# Patient Record
Sex: Male | Born: 1955 | Race: White | Hispanic: No | Marital: Married | Smoking: Current every day smoker
Health system: Southern US, Community
[De-identification: ages and names within clinical notes are randomized; demographics above are authoritative.]

## PROBLEM LIST (undated history)

## (undated) DIAGNOSIS — I10 Essential (primary) hypertension: Secondary | ICD-10-CM

## (undated) DIAGNOSIS — F32A Depression, unspecified: Secondary | ICD-10-CM

## (undated) DIAGNOSIS — E785 Hyperlipidemia, unspecified: Secondary | ICD-10-CM

## (undated) DIAGNOSIS — K759 Inflammatory liver disease, unspecified: Secondary | ICD-10-CM

## (undated) DIAGNOSIS — F410 Panic disorder [episodic paroxysmal anxiety] without agoraphobia: Secondary | ICD-10-CM

## (undated) DIAGNOSIS — E039 Hypothyroidism, unspecified: Secondary | ICD-10-CM

## (undated) DIAGNOSIS — M51369 Other intervertebral disc degeneration, lumbar region without mention of lumbar back pain or lower extremity pain: Secondary | ICD-10-CM

## (undated) DIAGNOSIS — M549 Dorsalgia, unspecified: Secondary | ICD-10-CM

## (undated) DIAGNOSIS — I519 Heart disease, unspecified: Secondary | ICD-10-CM

## (undated) DIAGNOSIS — J309 Allergic rhinitis, unspecified: Secondary | ICD-10-CM

## (undated) DIAGNOSIS — M5136 Other intervertebral disc degeneration, lumbar region: Secondary | ICD-10-CM

## (undated) DIAGNOSIS — J329 Chronic sinusitis, unspecified: Secondary | ICD-10-CM

## (undated) DIAGNOSIS — M5030 Other cervical disc degeneration, unspecified cervical region: Secondary | ICD-10-CM

## (undated) DIAGNOSIS — R7303 Prediabetes: Secondary | ICD-10-CM

## (undated) DIAGNOSIS — R519 Headache, unspecified: Secondary | ICD-10-CM

## (undated) DIAGNOSIS — I2510 Atherosclerotic heart disease of native coronary artery without angina pectoris: Secondary | ICD-10-CM

## (undated) HISTORY — DX: Chronic sinusitis, unspecified: J32.9

## (undated) HISTORY — DX: Hypothyroidism, unspecified: E03.9

## (undated) HISTORY — PX: OTHER SURGICAL HISTORY: SHX169

## (undated) HISTORY — DX: Dorsalgia, unspecified: M54.9

## (undated) HISTORY — DX: Heart disease, unspecified: I51.9

## (undated) HISTORY — DX: Other cervical disc degeneration, unspecified cervical region: M50.30

## (undated) HISTORY — DX: Allergic rhinitis, unspecified: J30.9

## (undated) HISTORY — DX: Hyperlipidemia, unspecified: E78.5

## (undated) HISTORY — DX: Panic disorder (episodic paroxysmal anxiety): F41.0

## (undated) HISTORY — PX: TONSILLECTOMY: SUR1361

## (undated) HISTORY — DX: Essential (primary) hypertension: I10

---
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
## 2004-05-20 ENCOUNTER — Ambulatory Visit (HOSPITAL_COMMUNITY): Admission: RE | Admit: 2004-05-20 | Discharge: 2004-05-20 | Payer: Self-pay | Admitting: Internal Medicine

## 2004-06-17 ENCOUNTER — Ambulatory Visit: Payer: Self-pay | Admitting: Gastroenterology

## 2004-07-18 ENCOUNTER — Ambulatory Visit: Payer: Self-pay | Admitting: Gastroenterology

## 2004-08-26 ENCOUNTER — Ambulatory Visit: Payer: Self-pay | Admitting: Gastroenterology

## 2004-09-19 ENCOUNTER — Ambulatory Visit: Payer: Self-pay | Admitting: Internal Medicine

## 2004-10-03 ENCOUNTER — Ambulatory Visit: Payer: Self-pay | Admitting: Gastroenterology

## 2004-10-21 ENCOUNTER — Ambulatory Visit: Payer: Self-pay | Admitting: Gastroenterology

## 2004-11-07 ENCOUNTER — Ambulatory Visit: Payer: Self-pay | Admitting: Gastroenterology

## 2004-12-05 ENCOUNTER — Ambulatory Visit: Payer: Self-pay | Admitting: Internal Medicine

## 2005-01-02 ENCOUNTER — Ambulatory Visit: Payer: Self-pay | Admitting: Internal Medicine

## 2005-01-30 ENCOUNTER — Ambulatory Visit: Payer: Self-pay | Admitting: Internal Medicine

## 2005-02-27 ENCOUNTER — Ambulatory Visit: Payer: Self-pay | Admitting: Gastroenterology

## 2005-03-27 ENCOUNTER — Ambulatory Visit: Payer: Self-pay | Admitting: Internal Medicine

## 2005-04-24 ENCOUNTER — Ambulatory Visit: Payer: Self-pay | Admitting: Gastroenterology

## 2005-05-22 ENCOUNTER — Ambulatory Visit: Payer: Self-pay | Admitting: Gastroenterology

## 2005-06-19 ENCOUNTER — Ambulatory Visit: Payer: Self-pay | Admitting: Gastroenterology

## 2005-07-10 ENCOUNTER — Ambulatory Visit: Payer: Self-pay | Admitting: Gastroenterology

## 2005-10-23 ENCOUNTER — Ambulatory Visit: Payer: Self-pay | Admitting: Gastroenterology

## 2006-01-29 ENCOUNTER — Ambulatory Visit: Payer: Self-pay | Admitting: Gastroenterology

## 2010-07-21 HISTORY — PX: CHOLECYSTECTOMY: SHX55

## 2012-12-29 DIAGNOSIS — M47817 Spondylosis without myelopathy or radiculopathy, lumbosacral region: Secondary | ICD-10-CM | POA: Insufficient documentation

## 2012-12-29 DIAGNOSIS — IMO0002 Reserved for concepts with insufficient information to code with codable children: Secondary | ICD-10-CM | POA: Insufficient documentation

## 2013-08-04 DIAGNOSIS — F411 Generalized anxiety disorder: Secondary | ICD-10-CM | POA: Insufficient documentation

## 2015-04-20 ENCOUNTER — Other Ambulatory Visit (HOSPITAL_COMMUNITY): Payer: Self-pay | Admitting: Physical Medicine and Rehabilitation

## 2015-04-20 DIAGNOSIS — M542 Cervicalgia: Secondary | ICD-10-CM

## 2015-05-05 ENCOUNTER — Ambulatory Visit (HOSPITAL_BASED_OUTPATIENT_CLINIC_OR_DEPARTMENT_OTHER): Payer: Self-pay

## 2015-05-05 ENCOUNTER — Ambulatory Visit (HOSPITAL_BASED_OUTPATIENT_CLINIC_OR_DEPARTMENT_OTHER)
Admission: RE | Admit: 2015-05-05 | Discharge: 2015-05-05 | Disposition: A | Discharge status: Home / Self Care | Payer: BLUE CROSS/BLUE SHIELD | Source: Ambulatory Visit | Attending: Physical Medicine and Rehabilitation | Admitting: Physical Medicine and Rehabilitation

## 2015-05-05 DIAGNOSIS — M542 Cervicalgia: Secondary | ICD-10-CM | POA: Diagnosis present

## 2015-05-05 DIAGNOSIS — M2578 Osteophyte, vertebrae: Secondary | ICD-10-CM | POA: Insufficient documentation

## 2015-05-05 DIAGNOSIS — R200 Anesthesia of skin: Secondary | ICD-10-CM | POA: Diagnosis not present

## 2015-05-05 DIAGNOSIS — M4802 Spinal stenosis, cervical region: Secondary | ICD-10-CM | POA: Diagnosis not present

## 2015-10-02 DIAGNOSIS — M7918 Myalgia, other site: Secondary | ICD-10-CM | POA: Insufficient documentation

## 2016-01-16 ENCOUNTER — Encounter: Payer: Self-pay | Admitting: Allergy and Immunology

## 2016-01-16 ENCOUNTER — Ambulatory Visit (INDEPENDENT_AMBULATORY_CARE_PROVIDER_SITE_OTHER): Payer: BLUE CROSS/BLUE SHIELD | Admitting: Allergy and Immunology

## 2016-01-16 VITALS — BP 110/64 | HR 72 | Temp 98.0°F | Resp 16 | Ht 64.57 in | Wt 122.6 lb

## 2016-01-16 DIAGNOSIS — J3089 Other allergic rhinitis: Secondary | ICD-10-CM | POA: Insufficient documentation

## 2016-01-16 DIAGNOSIS — J329 Chronic sinusitis, unspecified: Secondary | ICD-10-CM

## 2016-01-16 DIAGNOSIS — Z720 Tobacco use: Secondary | ICD-10-CM

## 2016-01-16 NOTE — Assessment & Plan Note (Signed)
   Tobacco cessation has been discussed and encouraged. 

## 2016-01-16 NOTE — Assessment & Plan Note (Addendum)
Immunocompetence will be assessed with labs.  The following labs have been ordered: CBC with differential, IgG, IgA and IgM as well as tetanus IgG and pneumococcal IgG titers. Post-vaccination titers will be drawn to assess response if IgG and pre-vaccination titers are low.    The patient will be called with further recommendations and follow-up instructions once the labs have returned.

## 2016-01-16 NOTE — Assessment & Plan Note (Signed)
   Aeroallergen avoidance measures have been discussed and provided in written form.  A sample and prescription have been provided for Dymista (azelastine/fluticasone) nasal spray, 1 spray per nostril twice daily as needed. Proper nasal spray technique has been discussed and demonstrated.  Nasal saline lavage (NeilMed) as needed has been recommended along with instructions for proper administration.  Guaifenesin 1200 mg (Mucinex Maximum Strength) twice daily as needed with adequate hydration as discussed.

## 2016-01-16 NOTE — Patient Instructions (Addendum)
Perennial allergic rhinitis  Aeroallergen avoidance measures have been discussed and provided in written form.  A sample and prescription have been provided for Dymista (azelastine/fluticasone) nasal spray, 1 spray per nostril twice daily as needed. Proper nasal spray technique has been discussed and demonstrated.  Nasal saline lavage (NeilMed) as needed has been recommended along with instructions for proper administration.  Guaifenesin 1200 mg (Mucinex Maximum Strength) twice daily as needed with adequate hydration as discussed.   Chronic sinusitis Immunocompetence will be assessed with labs.  The following labs have been ordered: CBC with differential, IgG, IgA and IgM as well as tetanus IgG and pneumococcal IgG titers. Post-vaccination titers will be drawn to assess response if IgG and pre-vaccination titers are low.    The patient will be called with further recommendations and follow-up instructions once the labs have returned.  Tobacco use  Tobacco cessation has been discussed and encouraged.    When lab results have returned the patient will be called with further recommendations and follow up instructions.  Control of House Dust Mite Allergen  House dust mites play a major role in allergic asthma and rhinitis.  They occur in environments with high humidity wherever human skin, the food for dust mites is found. High levels have been detected in dust obtained from mattresses, pillows, carpets, upholstered furniture, bed covers, clothes and soft toys.  The principal allergen of the house dust mite is found in its feces.  A gram of dust may contain 1,000 mites and 250,000 fecal particles.  Mite antigen is easily measured in the air during house cleaning activities.    1. Encase mattresses, including the box spring, and pillow, in an air tight cover.  Seal the zipper end of the encased mattresses with wide adhesive tape. 2. Wash the bedding in water of 130 degrees Farenheit weekly.   Avoid cotton comforters/quilts and flannel bedding: the most ideal bed covering is the dacron comforter. 3. Remove all upholstered furniture from the bedroom. 4. Remove carpets, carpet padding, rugs, and non-washable window drapes from the bedroom.  Wash drapes weekly or use plastic window coverings. 5. Remove all non-washable stuffed toys from the bedroom.  Wash stuffed toys weekly. 6. Have the room cleaned frequently with a vacuum cleaner and a damp dust-mop.  The patient should not be in a room which is being cleaned and should wait 1 hour after cleaning before going into the room. 7. Close and seal all heating outlets in the bedroom.  Otherwise, the room will become filled with dust-laden air.  An electric heater can be used to heat the room. 8. Reduce indoor humidity to less than 50%.  Do not use a humidifier.  Control of Cockroach Allergen  Cockroach allergen has been identified as an important cause of acute attacks of asthma, especially in urban settings.  There are fifty-five species of cockroach that exist in the [place], however only three, the [place], [place] and Oriental species produce allergen that can affect patients with Asthma.  Allergens can be obtained from fecal particles, egg casings and secretions from cockroaches.    1. Remove food sources. 2. Reduce access to water. 3. Seal access and entry points. 4. Spray runways with 0.5-1% Diazinon or Chlorpyrifos 5. Blow boric acid power under stoves and refrigerator. 6. Place bait stations (hydramethylnon) at feeding sites.

## 2016-01-16 NOTE — Progress Notes (Signed)
New Patient Note  RE: Justin ChattersRandy V Saunders MRN: [id number] DOB: 1956/03/03 Date of Office Visit: 01/16/2016  Referring provider: Patria ManeGassemi, Mike, MD Primary care provider: Patria ManeGASSEMI, MIKE, MD  Chief Complaint: Sinus Problem and Allergic Rhinitis    History of present illness: HPI Comments: Roma KayserRandy Saunders is a 60 y.o. male presenting today for consultation of rhinosinusitis.  He reports that he had the flu in late January 2017 and has "not felt right since."  He reports that he has required 2 courses of antibiotics and one course of steroids for sinusitis.  He complains of thick nasal discharge, postnasal drainage, hoarseness, throat clearing, nasal congestion, sinus pressure, and ear pressure/popping.  He reports that he had a sinus CT last week which was read as negative.  In addition to his nasal/sinus symptoms he reports that since January he has felt very fatigued, "swimmy headed", and has experienced more frequent GI symptoms.  He currently smokes 2-3 cigarettes per day and is trying to quit, however finds this challenging because his spouse smokes cigarettes.   Assessment and plan: Perennial allergic rhinitis  Aeroallergen avoidance measures have been discussed and provided in written form.  A sample and prescription have been provided for Dymista (azelastine/fluticasone) nasal spray, 1 spray per nostril twice daily as needed. Proper nasal spray technique has been discussed and demonstrated.  Nasal saline lavage (NeilMed) as needed has been recommended along with instructions for proper administration.  Guaifenesin 1200 mg (Mucinex Maximum Strength) twice daily as needed with adequate hydration as discussed.   Chronic sinusitis Immunocompetence will be assessed with labs.  The following labs have been ordered: CBC with differential, IgG, IgA and IgM as well as tetanus IgG and pneumococcal IgG titers. Post-vaccination titers will be drawn to assess response if IgG and pre-vaccination titers  are low.    The patient will be called with further recommendations and follow-up instructions once the labs have returned.  Tobacco use  Tobacco cessation has been discussed and encouraged.    Meds ordered this encounter  Medications  . Azelastine-Fluticasone (DYMISTA) 137-50 MCG/ACT SUSP    Sig: Place 1 spray into both nostrils 2 (two) times daily as needed.    Dispense:  1 Bottle    Refill:  5    Pt. Has coupon.    Diagnositics: Spirometry: FVC was 3.40 L (86%.  2) season FEV1 was 2.55 L (85% predicted).   Please see scanned spirometry results for details. Epicutaneous testing:  Negative despite a positive histamine control. Intradermal testing: Positive to cockroach antigen and dust mite antigen.    Physical examination: Blood pressure 110/64, pulse 72, temperature 98 F (36.7 C), temperature source Oral, resp. rate 16, height 5' 4.57" (1.64 m), weight 122 lb 9.2 oz (55.6 kg).  General: Alert, interactive, in no acute distress. HEENT: TMs pearly gray, turbinates moderately edematous without discharge, post-pharynx erythematous. Neck: Supple without lymphadenopathy. Lungs: Clear to auscultation without wheezing, rhonchi or rales. CV: Normal S1, S2 without murmurs. Abdomen: Nondistended, nontender. Skin: Warm and dry, without lesions or rashes. Extremities:  No clubbing, cyanosis or edema. Neuro:   Grossly intact.  Review of systems:  Review of Systems  Constitutional: Positive for malaise/fatigue. Negative for fever, chills and weight loss.  HENT: Positive for congestion. Negative for nosebleeds.   Eyes: Negative for blurred vision.  Respiratory: Negative for hemoptysis, shortness of breath and wheezing.   Cardiovascular: Negative for chest pain.  Gastrointestinal: Negative for diarrhea and constipation.  Genitourinary: Negative for dysuria.  Musculoskeletal: Negative  for myalgias and joint pain.  Skin: Negative for itching and rash.  Neurological: Positive for  headaches. Negative for dizziness.  Endo/Heme/Allergies: Positive for environmental allergies. Does not bruise/bleed easily.    Past medical history:  Past Medical History  Diagnosis Date  . Sinusitis   . Hyperlipidemia   . Hypertension   . Panic attacks   . Hypothyroidism   . Allergic rhinitis   . Heart disease   . Back pain   . DDD (degenerative disc disease), cervical     thoracic and lumbar    Past surgical history:  Past Surgical History  Procedure Laterality Date  . Cholecystectomy  2012  . Appendectomy  1974  . Tonsillectomy    . Stents  [id number]    3 heart stents 1st surgery,then 1 stent 2cd surgery  . Epideral steroid injections      in cervical,thoracic and lumbar    Family history: Family History  Problem Relation Age of Onset  . Allergic rhinitis Neg Hx   . Angioedema Neg Hx   . Asthma Neg Hx   . Eczema Neg Hx   . Immunodeficiency Neg Hx   . Urticaria Neg Hx     Social history: Social History   Social History  . Marital Status: Married    Spouse Name: N/A  . Number of Children: N/A  . Years of Education: N/A   Occupational History  . Not on file.   Social History Main Topics  . Smoking status: Current Every Day Smoker -- 1.00 packs/day for 42 years    Types: Cigarettes  . Smokeless tobacco: Not on file  . Alcohol Use: No  . Drug Use: No  . Sexual Activity: Not on file   Other Topics Concern  . Not on file   Social History Narrative  . No narrative on file   Environmental History: The patient lives in 61 year old house with hardwood floors in the bedroom, gas heat and window air conditioning units.  There are 2 dogs in house which have access to his bedroom.  He has been smoking cigarettes since 1973 and currently smokes 2-3 cigarettes per day.    Medication List       This list is accurate as of: 01/16/16 11:59 PM.  Always use your most recent med list.               ALPRAZolam 1 MG tablet  Commonly known as:  XANAX      aspirin EC 81 MG tablet  Take 81 mg by mouth.     atorvastatin 80 MG tablet  Commonly known as:  LIPITOR  Take 80 mg by mouth.     Azelastine-Fluticasone 137-50 MCG/ACT Susp  Commonly known as:  DYMISTA  Place 1 spray into both nostrils 2 (two) times daily as needed.     butalbital-acetaminophen-caffeine 50-325-40 MG tablet  Commonly known as:  FIORICET, ESGIC  Take by mouth.     EXCEDRIN PO  Take by mouth.     FLONASE 50 MCG/ACT nasal spray  Generic drug:  fluticasone     ibuprofen 800 MG tablet  Commonly known as:  ADVIL,MOTRIN  Take 800 mg by mouth.     levothyroxine 25 MCG tablet  Commonly known as:  SYNTHROID, LEVOTHROID  Take by mouth.     lisinopril 20 MG tablet  Commonly known as:  PRINIVIL,ZESTRIL  Take 20 mg by mouth.     metoprolol succinate 25 MG 24 hr tablet  Commonly known as:  TOPROL-XL  Take 25 mg by mouth.     XYZAL 5 MG tablet  Generic drug:  levocetirizine  Take 5 mg by mouth.        Known medication allergies: Allergies  Allergen Reactions  . Codeine Nausea And Vomiting  . Hydromorphone Nausea And Vomiting    I appreciate the opportunity to take part in Zev's care. Please do not hesitate to contact me with questions.  Sincerely,   R. Jorene Guest, MD

## 2016-01-17 LAB — CBC WITH DIFFERENTIAL/PLATELET
Basophils Absolute: 0 cells/uL (ref 0–200)
Basophils Relative: 0 %
Eosinophils Absolute: 460 cells/uL (ref 15–500)
Eosinophils Relative: 5 %
HCT: 37.7 % — ABNORMAL LOW (ref 38.5–50.0)
Hemoglobin: 12.9 g/dL — ABNORMAL LOW (ref 13.2–17.1)
Lymphocytes Relative: 31 %
Lymphs Abs: 2852 cells/uL (ref 850–3900)
MCH: 34.7 pg — ABNORMAL HIGH (ref 27.0–33.0)
MCHC: 34.2 g/dL (ref 32.0–36.0)
MCV: 101.3 fL — ABNORMAL HIGH (ref 80.0–100.0)
MPV: 8.9 fL (ref 7.5–12.5)
Monocytes Absolute: 736 cells/uL (ref 200–950)
Monocytes Relative: 8 %
Neutro Abs: 5152 cells/uL (ref 1500–7800)
Neutrophils Relative %: 56 %
Platelets: 259 10*3/uL (ref 140–400)
RBC: 3.72 MIL/uL — ABNORMAL LOW (ref 4.20–5.80)
RDW: 13.8 % (ref 11.0–15.0)
WBC: 9.2 10*3/uL (ref 3.8–10.8)

## 2016-01-17 MED ORDER — AZELASTINE-FLUTICASONE 137-50 MCG/ACT NA SUSP: 1.0000 | Freq: Two times a day (BID) | NASAL | Status: DC | PRN

## 2016-01-18 LAB — IGG, IGA, IGM
IgA: 187 mg/dL (ref 81–463)
IgG (Immunoglobin G), Serum: 660 mg/dL — ABNORMAL LOW (ref 694–1618)
IgM, Serum: 26 mg/dL — ABNORMAL LOW (ref 48–271)

## 2016-01-18 LAB — IGE: IgE (Immunoglobulin E), Serum: 10 kU/L (ref <115)

## 2016-01-19 LAB — TETANUS ANTIBODY, IGG: Tetanus Antitoxid Ab: 1.22 IU/mL (ref >0.15)

## 2016-01-20 LAB — STREP PNEUMONIAE 14 SEROTYPES IGG
Strep pneumo Type 12: <0.3
Strep pneumo Type 19: 0.8
Strep pneumo Type 4: 0.5
Strep pneumo Type 9: 1.7
Strep pneumoniae Type 1 Abs: <0.3
Strep pneumoniae Type 14 Abs: 12.5
Strep pneumoniae Type 18C Abs: 3.9
Strep pneumoniae Type 23F Abs: 27.3
Strep pneumoniae Type 3 Abs: 0.9
Strep pneumoniae Type 5 Abs: 6.8
Strep pneumoniae Type 6B Abs: 1.3
Strep pneumoniae Type 7F Abs: 0.4
Strep pneumoniae Type 8 Abs: 3.2
Strep pneumoniae Type 9N Abs: 1.3

## 2016-01-23 ENCOUNTER — Other Ambulatory Visit: Payer: Self-pay | Admitting: *Deleted

## 2016-01-23 MED ORDER — AZELASTINE HCL 0.15 % NA SOLN: 2.0000 | Freq: Two times a day (BID) | NASAL | Status: DC

## 2016-01-23 MED ORDER — FLUTICASONE PROPIONATE 50 MCG/ACT NA SUSP: 1.0000 | Freq: Two times a day (BID) | NASAL | Status: DC

## 2016-01-23 NOTE — Telephone Encounter (Signed)
Insurance will not cover Dymista. Split medication into Azelastine and Fluticasone.

## 2016-07-02 ENCOUNTER — Telehealth (INDEPENDENT_AMBULATORY_CARE_PROVIDER_SITE_OTHER): Payer: Self-pay | Admitting: Physical Medicine and Rehabilitation

## 2016-07-02 DIAGNOSIS — M545 Low back pain, unspecified: Secondary | ICD-10-CM

## 2016-07-02 NOTE — Telephone Encounter (Signed)
Ov is all I got, Have not seens him in a while and he was seeing a chiropractor and [place] [facility] last time I knew. We have done L4 trans in past but it has been a while

## 2016-07-03 MED ORDER — PREDNISONE 50 MG PO TABS: ORAL_TABLET | ORAL | 0 refills | Status: DC

## 2016-07-03 NOTE — Telephone Encounter (Signed)
Sent prednisone to pharmacy on record

## 2016-07-03 NOTE — Telephone Encounter (Signed)
Called patient to notify him prescription sent.

## 2016-07-03 NOTE — Addendum Note (Signed)
Addended by: Ashok NorrisNEWTON, Rayana Geurin K on: 07/03/2016 12:43 PM   Modules accepted: Orders

## 2016-07-24 ENCOUNTER — Encounter (INDEPENDENT_AMBULATORY_CARE_PROVIDER_SITE_OTHER): Payer: Self-pay

## 2016-07-24 ENCOUNTER — Telehealth (INDEPENDENT_AMBULATORY_CARE_PROVIDER_SITE_OTHER): Payer: Self-pay | Admitting: Physical Medicine and Rehabilitation

## 2016-07-24 ENCOUNTER — Ambulatory Visit (INDEPENDENT_AMBULATORY_CARE_PROVIDER_SITE_OTHER): Payer: BLUE CROSS/BLUE SHIELD | Admitting: Physical Medicine and Rehabilitation

## 2016-07-24 VITALS — BP 150/84 | HR 58

## 2016-07-24 DIAGNOSIS — M5416 Radiculopathy, lumbar region: Secondary | ICD-10-CM

## 2016-07-24 DIAGNOSIS — M5442 Lumbago with sciatica, left side: Secondary | ICD-10-CM

## 2016-07-24 DIAGNOSIS — G8929 Other chronic pain: Secondary | ICD-10-CM

## 2016-07-24 DIAGNOSIS — M47816 Spondylosis without myelopathy or radiculopathy, lumbar region: Secondary | ICD-10-CM

## 2016-07-24 DIAGNOSIS — M5441 Lumbago with sciatica, right side: Secondary | ICD-10-CM

## 2016-07-24 DIAGNOSIS — M5010 Cervical disc disorder with radiculopathy, unspecified cervical region: Secondary | ICD-10-CM | POA: Diagnosis not present

## 2016-07-24 DIAGNOSIS — M4802 Spinal stenosis, cervical region: Secondary | ICD-10-CM

## 2016-07-24 NOTE — Progress Notes (Signed)
Justin Saunders - 61 y.o. male MRN [id number]  Date of birth: 12-Mar-1956  Office Visit Note: Visit Date: 07/24/2016 PCP: Patria Mane, MD Referred by: Patria Mane, MD  Subjective: Chief Complaint  Patient presents with  . Lower Back - Pain   HPI: Justin Saunders is a 61 year old gentleman who is well-known to me. 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 He has a history of hepatitis C which has been treated. I haven't seen him in quite a while and I did note through talking with him on occasion that he had been seeing some physicians and [place]. He has seen a chiropractor there are a few occasions and then ultimately had an MRI of his cervical spine performed which evidently showed quite a bit of issue with stenosis and spurring and he is seen 2 spine surgeons and both recommended decompression surgery. We talked briefly about that. His main complaint today was actually lower back pain which is really worsened since I've seen him. We saw on it was more upper lumbar lower thoracic pain. He has a new MRI dated 07/22/2016 that he brought which is on disc. The report was found on her system and is described below. This was done in [facility]. And basically this was done in December of this year and shows multilevel facet arthropathy with degenerative disc change and disc height loss severely at L2-S3 which is known. He has disc bulging with some downturning particularly at L5-S1. He has not had any specific trauma just worsening complaints. Both sides hurt- right more than left. Beginning to move into buttock and is starting to keep him out of work. Pain is about a 3 with sitting. Movement increases it to about an 8. About 3 weeks ago could not put weight on right foot. This episode of not being able to bear weight was basically pain in the buttock region on the right. He did go to urgent care and received basically an intramuscular injection of Toradol and probably Depo-Medrol. It did seem  to help for a few hours and did seem to get him over the home. Overall he's had just worsening pain.     Review of Systems  Constitutional: Negative for chills, fever, malaise/fatigue and weight loss.  HENT: Negative for hearing loss and sinus pain.   Eyes: Negative for blurred vision, double vision and photophobia.  Respiratory: Negative for cough and shortness of breath.   Cardiovascular: Negative for chest pain, palpitations and leg swelling.  Gastrointestinal: Negative for abdominal pain, nausea and vomiting.  Genitourinary: Negative for flank pain.  Musculoskeletal: Positive for back pain, joint pain and neck pain. Negative for myalgias.  Skin: Negative for itching and rash.  Neurological: Negative for tremors, focal weakness and weakness.  Endo/Heme/Allergies: Negative.   Psychiatric/Behavioral: Negative for depression.  All other systems reviewed and are negative.  Otherwise per HPI.  Assessment & Plan: Visit Diagnoses:  1. Lumbar radiculopathy   2. Spondylosis without myelopathy or radiculopathy, lumbar region   3. Chronic bilateral low back pain with bilateral sciatica   4. Cervical disc disorder with radiculopathy   5. Spinal stenosis of cervical region     Plan: Findings:  Chronic pain syndrome and chronic worsening low back pain somewhat axial worse with twisting and turning but also worse with movement. He does get some pain with prolonged sitting. It seems to be multifocal mechanical pain which could be facet mediated or related to the disc with some narrowing of  the lateral recesses. We've reviewed the images on the MRI there is more lateral recess narrowing but really anything else. There is no focal disc herniations and there is some facet arthropathy really more in the upper lumbar region the lower lumbar region but still present. I do think he would do well with bilateral L5 transforaminal epidural steroid injections diagnostically and hopefully therapeutically. He's  done well in the past. If it didn't seem to help I would look at least at one time doing facet joint block. He has had radiofrequency ablation of the upper joints and this might be something that we could look at for the lower joints if that was diagnostic. Right now are still a diagnostic phase. We talked about activity modification and exercises. He has been through physical therapy in the past. He continues with current medications. I would not change anything at this point. In terms of his cervical spine he is going in having surgery at some point. I did not review that fully today. I spent more than 25 minutes speaking face-to-face with the patient with 50% of the time in counseling.    Meds & Orders: No orders of the defined types were placed in this encounter.  No orders of the defined types were placed in this encounter.   Follow-up: Return for Scheduled bilateral L5 transforaminal epidural steroid injection.   Procedures: No procedures performed  No notes on file   Clinical History: Lumbar spine MRI dated 07/22/2016  1. No notable change compared to 2014. 2. L2-3 advanced degenerative disc narrowing with borderline moderate left foraminal stenosis. 3. L3-4 to L5-S1 disc bulging with noncompressive bilateral foraminal narrowing.  Cervical spine MRI10/15/2016  IMPRESSION: Severe right foraminal narrowing at C5-6 due to uncovertebral spurring. There is mild deformity of the ventral right hemicord at this level due to a disc osteophyte complex to the right.  Severe right and moderate to moderately severe left foraminal narrowing C6-7 due to uncovertebral disease.  He reports that he has been smoking Cigarettes.  He has a 42.00 pack-year smoking history. He does not have any smokeless tobacco history on file. No results for input(s): HGBA1C, LABURIC in the last 8760 hours.  Objective:  VS:  HT:    WT:   BMI:     BP:(!) 150/84  HR:(!) 58bpm  TEMP: ( )  RESP:  Physical Exam    Constitutional: He is oriented to person, place, and time. He appears well-developed and well-nourished. No distress.  HENT:  Head: Normocephalic and atraumatic.  Nose: Nose normal.  Mouth/Throat: Oropharynx is clear and moist.  Eyes: Conjunctivae are normal. Pupils are equal, round, and reactive to light.  Neck: Normal range of motion. Neck supple.  Cardiovascular: Regular rhythm and intact distal pulses.   Pulmonary/Chest: Effort normal and breath sounds normal.  Abdominal: Soft. He exhibits no distension.  Musculoskeletal: He exhibits no deformity.  Cervical range of motion is limited in ranges with pain with extension. His lumbar spine is in forward flexion with pain with extension rotation. Very stiff throughout the lumbar spine. Very tight paraspinal musculature with concordant pain in the quadratus lumborum area. Mild pain over the greater trochanters. No hip pain with internal rotation. Good distal strength. He has no clonus bilaterally. He does have increased pain with also a.  Neurological: He is alert and oriented to person, place, and time. He displays normal reflexes. No cranial nerve deficit. He exhibits normal muscle tone. Coordination normal.  Skin: Skin is warm. No rash noted.  Psychiatric: He has a normal mood and affect. His behavior is normal.  Nursing note and vitals reviewed.   Ortho Exam Imaging: No results found.  Past Medical/Family/Surgical/Social History: Medications & Allergies reviewed per EMR Patient Active Problem List   Diagnosis Date Noted  . Perennial allergic rhinitis 01/16/2016  . Chronic sinusitis 01/16/2016  . Tobacco use 01/16/2016   Past Medical History:  Diagnosis Date  . Allergic rhinitis   . Back pain   . DDD (degenerative disc disease), cervical    thoracic and lumbar  . Heart disease   . Hyperlipidemia   . Hypertension   . Hypothyroidism   . Panic attacks   . Sinusitis    Family History  Problem Relation Age of Onset  .  Allergic rhinitis Neg Hx   . Angioedema Neg Hx   . Asthma Neg Hx   . Eczema Neg Hx   . Immunodeficiency Neg Hx   . Urticaria Neg Hx    Past Surgical History:  Procedure Laterality Date  . APPENDECTOMY  1974  . CHOLECYSTECTOMY  2012  . epideral steroid injections     in cervical,thoracic and lumbar  . stents  [id number]   3 heart stents 1st surgery,then 1 stent 2cd surgery  . TONSILLECTOMY     Social History   Occupational History  . Not on file.   Social History Main Topics  . Smoking status: Current Every Day Smoker    Packs/day: 1.00    Years: 42.00    Types: Cigarettes  . Smokeless tobacco: Not on file  . Alcohol use No  . Drug use: No  . Sexual activity: Not on file

## 2016-07-25 ENCOUNTER — Encounter (INDEPENDENT_AMBULATORY_CARE_PROVIDER_SITE_OTHER): Payer: Self-pay | Admitting: Physical Medicine and Rehabilitation

## 2016-07-28 ENCOUNTER — Encounter (INDEPENDENT_AMBULATORY_CARE_PROVIDER_SITE_OTHER): Payer: Self-pay | Admitting: Physical Medicine and Rehabilitation

## 2016-07-28 ENCOUNTER — Ambulatory Visit (INDEPENDENT_AMBULATORY_CARE_PROVIDER_SITE_OTHER): Payer: BLUE CROSS/BLUE SHIELD | Admitting: Physical Medicine and Rehabilitation

## 2016-07-28 VITALS — BP 141/85 | HR 72 | Temp 98.5°F

## 2016-07-28 DIAGNOSIS — M5416 Radiculopathy, lumbar region: Secondary | ICD-10-CM

## 2016-07-28 MED ORDER — METHYLPREDNISOLONE ACETATE 80 MG/ML IJ SUSP
80.0000 mg | Freq: Once | INTRAMUSCULAR | Status: AC
  Administered 2016-07-28: 80 mg

## 2016-07-28 MED ORDER — LIDOCAINE HCL (PF) 1 % IJ SOLN
0.3300 mL | Freq: Once | INTRAMUSCULAR | Status: AC
  Administered 2016-07-28: 0.3 mL

## 2016-07-28 NOTE — Progress Notes (Signed)
Maureen ChattersRandy V Bevens - 61 y.o. male MRN [id number]  Date of birth: 04/04/1956  Office Visit Note: Visit Date: 07/28/2016 PCP: Patria ManeGASSEMI, MIKE, MD Referred by: Patria ManeGassemi, Mike, MD  Subjective: Chief Complaint  Patient presents with  . Lower Back - Pain   HPI: [name] is a 61 year old gentleman with chronic worsening low back and bilateral radicular type pain to the hips. Patient is here today for planned bilateral L4 or L5 transforaminal injection. No change in symptoms.    ROS Otherwise per HPI.  Assessment & Plan: Visit Diagnoses:  1. Lumbar radiculopathy     Plan: Findings:  Plan bilateral L5 transforaminal epidural steroid injection for low back and radicular pain. Please see our prior evaluation and management note for further details and justification.    Meds & Orders:  Meds ordered this encounter  Medications  . lidocaine (PF) (XYLOCAINE) 1 % injection 0.3 mL  . methylPREDNISolone acetate (DEPO-MEDROL) injection 80 mg    Orders Placed This Encounter  Procedures  . Epidural Steroid injection    Follow-up: Return if symptoms worsen or fail to improve, 2 weeks.   Procedures: No procedures performed  Lumbosacral Transforaminal Epidural Steroid Injection - Infraneural Approach with Fluoroscopic Guidance  Patient: Maureen ChattersRandy V Mrozek      Date of Birth: 04/04/1956 MRN: [id number] PCP: Patria ManeGASSEMI, MIKE, MD      Visit Date: 07/28/2016   Universal Protocol:    Date/Time: 01/09/185:28 AM  Consent Given By: the patient  Position: PRONE   Additional Comments: Vital signs were monitored before and after the procedure. Patient was prepped and draped in the usual sterile fashion. The correct patient, procedure, and site was verified.   Injection Procedure Details:  Procedure Site One Meds Administered:  Meds ordered this encounter  Medications  . lidocaine (PF) (XYLOCAINE) 1 % injection 0.3 mL  . methylPREDNISolone acetate (DEPO-MEDROL) injection 80 mg      Laterality:  Bilateral  Location/Site:  L5-S1  Needle size: 22 G  Needle type: Spinal  Needle Placement: Transforaminal  Findings:  -Contrast Used: 2 mL iohexol 180 mg iodine/mL   -Comments: Excellent flow of contrast along the nerve and into the epidural space.  Procedure Details: After squaring off the end-plates of the desired vertebral level to get a true AP view, the C-arm was obliqued to the painful side so that the superior articulating process is positioned about 1/3 the length of the inferior endplate.  The needle was aimed toward the junction of the superior articular process and the transverse process of the inferior vertebrae. The needle's initial entry is in the lower third of the foramen through Kambin's triangle. The soft tissues overlying this target were infiltrated with 2-3 ml. of 1% Lidocaine without Epinephrine.  The spinal needle was then inserted and advanced toward the target using a "trajectory" view along the fluoroscope beam.  Under AP and lateral visualization, the needle was advanced so it did not puncture dura and did not traverse medially beyond the 6 o'clock position of the pedicle. Bi-planar projections were used to confirm position. Aspiration was confirmed to be negative for CSF and/or blood. A 1-2 ml. volume of Isovue-250 was injected and flow of contrast was noted at each level. Radiographs were obtained for documentation purposes.   After attaining the desired flow of contrast documented above, a 0.5 to 1.0 ml test dose of 0.25% Marcaine was injected into each respective transforaminal space.  The patient was observed for 90 seconds post injection.  After no  sensory deficits were reported, and normal lower extremity motor function was noted,   the above injectate was administered so that equal amounts of the injectate were placed at each foramen (level) into the transforaminal epidural space.   Additional Comments:  The patient tolerated the procedure well Dressing:  Band-Aid    Post-procedure details: Patient was observed during the procedure. Post-procedure instructions were reviewed.  Patient left the clinic in stable condition.     Clinical History: Lumbar spine MRI dated 07/22/2016  1. No notable change compared to 2014. 2. L2-3 advanced degenerative disc narrowing with borderline moderate left foraminal stenosis. 3. L3-4 to L5-S1 disc bulging with noncompressive bilateral foraminal narrowing.  Cervical spine MRI10/15/2016  IMPRESSION: Severe right foraminal narrowing at C5-6 due to uncovertebral spurring. There is mild deformity of the ventral right hemicord at this level due to a disc osteophyte complex to the right.  Severe right and moderate to moderately severe left foraminal narrowing C6-7 due to uncovertebral disease.  He reports that he has been smoking Cigarettes.  He has a 42.00 pack-year smoking history. He has never used smokeless tobacco. No results for input(s): HGBA1C, LABURIC in the last 8760 hours.  Objective:  VS:  HT:    WT:   BMI:     BP:(!) 141/85  HR:72bpm  TEMP:98.5 F (36.9 C)(Oral)  RESP:98 % Physical Exam  Musculoskeletal:  Patient ambulates without aid with good distal strength.    Ortho Exam Imaging: No results found.  Past Medical/Family/Surgical/Social History: Medications & Allergies reviewed per EMR Patient Active Problem List   Diagnosis Date Noted  . Perennial allergic rhinitis 01/16/2016  . Chronic sinusitis 01/16/2016  . Tobacco use 01/16/2016   Past Medical History:  Diagnosis Date  . Allergic rhinitis   . Back pain   . DDD (degenerative disc disease), cervical    thoracic and lumbar  . Heart disease   . Hyperlipidemia   . Hypertension   . Hypothyroidism   . Panic attacks   . Sinusitis    Family History  Problem Relation Age of Onset  . Allergic rhinitis Neg Hx   . Angioedema Neg Hx   . Asthma Neg Hx   . Eczema Neg Hx   . Immunodeficiency Neg Hx   . Urticaria Neg Hx     Past Surgical History:  Procedure Laterality Date  . APPENDECTOMY  1974  . CHOLECYSTECTOMY  2012  . epideral steroid injections     in cervical,thoracic and lumbar  . stents  1610,9604   3 heart stents 1st surgery,then 1 stent 2cd surgery  . TONSILLECTOMY     Social History   Occupational History  . Not on file.   Social History Main Topics  . Smoking status: Current Every Day Smoker    Packs/day: 1.00    Years: 42.00    Types: Cigarettes  . Smokeless tobacco: Never Used  . Alcohol use No  . Drug use: No  . Sexual activity: Not on file

## 2016-07-28 NOTE — Patient Instructions (Signed)

## 2016-07-29 NOTE — Telephone Encounter (Signed)
Patient scheduled, injection completed.

## 2016-07-29 NOTE — Procedures (Signed)
Lumbosacral Transforaminal Epidural Steroid Injection - Infraneural Approach with Fluoroscopic Guidance  Patient: Justin ChattersRandy V Saunders      Date of Birth: 05-Jan-1956 MRN: [id number] PCP: Patria ManeGASSEMI, MIKE, MD      Visit Date: 07/28/2016   Universal Protocol:    Date/Time: 01/09/185:28 AM  Consent Given By: the patient  Position: PRONE   Additional Comments: Vital signs were monitored before and after the procedure. Patient was prepped and draped in the usual sterile fashion. The correct patient, procedure, and site was verified.   Injection Procedure Details:  Procedure Site One Meds Administered:  Meds ordered this encounter  Medications  . lidocaine (PF) (XYLOCAINE) 1 % injection 0.3 mL  . methylPREDNISolone acetate (DEPO-MEDROL) injection 80 mg      Laterality: Bilateral  Location/Site:  L5-S1  Needle size: 22 G  Needle type: Spinal  Needle Placement: Transforaminal  Findings:  -Contrast Used: 2 mL iohexol 180 mg iodine/mL   -Comments: Excellent flow of contrast along the nerve and into the epidural space.  Procedure Details: After squaring off the end-plates of the desired vertebral level to get a true AP view, the C-arm was obliqued to the painful side so that the superior articulating process is positioned about 1/3 the length of the inferior endplate.  The needle was aimed toward the junction of the superior articular process and the transverse process of the inferior vertebrae. The needle's initial entry is in the lower third of the foramen through Kambin's triangle. The soft tissues overlying this target were infiltrated with 2-3 ml. of 1% Lidocaine without Epinephrine.  The spinal needle was then inserted and advanced toward the target using a "trajectory" view along the fluoroscope beam.  Under AP and lateral visualization, the needle was advanced so it did not puncture dura and did not traverse medially beyond the 6 o'clock position of the pedicle. Bi-planar  projections were used to confirm position. Aspiration was confirmed to be negative for CSF and/or blood. A 1-2 ml. volume of Isovue-250 was injected and flow of contrast was noted at each level. Radiographs were obtained for documentation purposes.   After attaining the desired flow of contrast documented above, a 0.5 to 1.0 ml test dose of 0.25% Marcaine was injected into each respective transforaminal space.  The patient was observed for 90 seconds post injection.  After no sensory deficits were reported, and normal lower extremity motor function was noted,   the above injectate was administered so that equal amounts of the injectate were placed at each foramen (level) into the transforaminal epidural space.   Additional Comments:  The patient tolerated the procedure well Dressing: Band-Aid    Post-procedure details: Patient was observed during the procedure. Post-procedure instructions were reviewed.  Patient left the clinic in stable condition.

## 2017-06-22 ENCOUNTER — Telehealth (INDEPENDENT_AMBULATORY_CARE_PROVIDER_SITE_OTHER): Payer: Self-pay | Admitting: Physical Medicine and Rehabilitation

## 2017-06-22 NOTE — Telephone Encounter (Signed)
Scheduled for 12/13 at 1530 for repeat ESI. Patient also wants to discuss possible repeat RFA, so I have scheduled him for 30 minutes.

## 2017-06-22 NOTE — Telephone Encounter (Signed)
Repeat ok 

## 2017-07-02 ENCOUNTER — Ambulatory Visit (INDEPENDENT_AMBULATORY_CARE_PROVIDER_SITE_OTHER): Payer: BLUE CROSS/BLUE SHIELD | Admitting: Physical Medicine and Rehabilitation

## 2017-07-02 ENCOUNTER — Ambulatory Visit (INDEPENDENT_AMBULATORY_CARE_PROVIDER_SITE_OTHER): Payer: Self-pay

## 2017-07-02 ENCOUNTER — Encounter (INDEPENDENT_AMBULATORY_CARE_PROVIDER_SITE_OTHER): Payer: Self-pay | Admitting: Physical Medicine and Rehabilitation

## 2017-07-02 VITALS — BP 126/73 | HR 83 | Temp 98.4°F

## 2017-07-02 DIAGNOSIS — G894 Chronic pain syndrome: Secondary | ICD-10-CM | POA: Diagnosis not present

## 2017-07-02 DIAGNOSIS — M5416 Radiculopathy, lumbar region: Secondary | ICD-10-CM | POA: Diagnosis not present

## 2017-07-02 DIAGNOSIS — M5116 Intervertebral disc disorders with radiculopathy, lumbar region: Secondary | ICD-10-CM

## 2017-07-02 MED ORDER — LIDOCAINE HCL (PF) 1 % IJ SOLN
2.0000 mL | Freq: Once | INTRAMUSCULAR | Status: AC
  Administered 2017-07-02: 2 mL

## 2017-07-02 MED ORDER — METHYLPREDNISOLONE ACETATE 80 MG/ML IJ SUSP
80.0000 mg | Freq: Once | INTRAMUSCULAR | Status: AC
  Administered 2017-07-02: 80 mg

## 2017-07-02 NOTE — Progress Notes (Deleted)
Pt here for injection of LSP has pain in the buttocks area radiates to right leg can not bear weight at times. No numbness or tingling. Pain scale 1/10 sitting still, when gets up in the am 8/10 gets pt awake at night. 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 No allergy to contrast. Hurts worse when walking.

## 2017-07-02 NOTE — Patient Instructions (Signed)

## 2017-07-10 NOTE — Procedures (Signed)
[name] is a 61 year old gentleman that is well-known to me over the last several years.  I have not seen him since January 2018, this year, when we completed bilateral L5 transforaminal epidural steroid injection for low back and bilateral hip and leg pain.  He has a prolonged history of chronic pain both in the neck and upper back as well as lower back.  We have completed radiofrequency ablation of his upper lumbar spine where he has degenerative changes and facet arthropathy.  He comes in today with really not complaints of his back is much as his right hip and leg.  He has had some recent episodes where he just felt like he could even bear weight on the right leg because he would get pain into the hip and thigh.  No real numbness or tingling.  He reports when he gets up first thing in the morning gets an 8 out of 10 and then currently right now is just a 1 out of 10 with sitting.  MRI from the past shows facet arthropathy and degenerative changes at L5-S1.  I think the best approach is a diagnostic and hopefully therapeutic right L5 transforaminal injection.  If this does not seem to help him we may look at diagnostic imaging or possibly facet joint block.  He might do well with regrouping with a physical therapist this is been a while.  [facility] gets a [facility] of [facility] in [facility] and has been seeing them over some time as well.  He has had no new injuries or focal weakness.  No real red flag complaints.  Exam is really nonfocal with good distal strength.  He has no pain with hip rotation.  Lumbosacral Transforaminal Epidural Steroid Injection - Infraneural Approach with Fluoroscopic Guidance  Patient: Justin Saunders      Date of Birth: [birth year] MRN: [id number] PCP: [name], [name], MD      Visit Date: 07/02/2017   Universal Protocol:     Consent Given By: the patient  Position: PRONE   Additional Comments: Vital signs were monitored before and after the procedure. Patient was prepped and  draped in the usual sterile fashion. The correct patient, procedure, and site was verified.   Injection Procedure Details:  Procedure Site One Meds Administered:  Meds ordered this encounter  Medications  . lidocaine (PF) (XYLOCAINE) 1 % injection 2 mL  . methylPREDNISolone acetate (DEPO-MEDROL) injection 80 mg      Laterality: Right  Location/Site:  L5-S1  Needle size: 22 G  Needle type: Spinal  Needle Placement: Transforaminal  Findings:   -Comments: Excellent flow of contrast along the nerve and into the epidural space.  Procedure Details: After squaring off the end-plates of the desired vertebral level to get a true AP view, the C-arm was obliqued to the painful side so that the superior articulating process is positioned about 1/3 the length of the inferior endplate.  The needle was aimed toward the junction of the superior articular process and the transverse process of the inferior vertebrae. The needle's initial entry is in the lower third of the foramen through Kambin's triangle. The soft tissues overlying this target were infiltrated with 2-3 ml. of 1% Lidocaine without Epinephrine.  The spinal needle was then inserted and advanced toward the target using a "trajectory" view along the fluoroscope beam.  Under AP and lateral visualization, the needle was advanced so it did not puncture dura and did not traverse medially beyond the 6 o'clock position  of the pedicle. Bi-planar projections were used to confirm position. Aspiration was confirmed to be negative for CSF and/or blood. A 1-2 ml. volume of Isovue-250 was injected and flow of contrast was noted at each level. Radiographs were obtained for documentation purposes.   After attaining the desired flow of contrast documented above, a 0.5 to 1.0 ml test dose of 0.25% Marcaine was injected into each respective transforaminal space.  The patient was observed for 90 seconds post injection.  After no sensory deficits were  reported, and normal lower extremity motor function was noted,   the above injectate was administered so that equal amounts of the injectate were placed at each foramen (level) into the transforaminal epidural space.   Additional Comments:  The patient tolerated the procedure well Dressing: Band-Aid    Post-procedure details: Patient was observed during the procedure. Post-procedure instructions were reviewed.  Patient left the clinic in stable condition.   Pertinent Imaging: Lumbar spine MRI dated 07/22/2016  1. No notable change compared to 2014. 2. L2-3 advanced degenerative disc narrowing with borderline moderate left foraminal stenosis. 3. L3-4 to L5-S1 disc bulging with noncompressive bilateral foraminal narrowing.  Cervical spine MRI10/15/2016  IMPRESSION: Severe right foraminal narrowing at C5-6 due to uncovertebral spurring. There is mild deformity of the ventral right hemicord at this level due to a disc osteophyte complex to the right.  Severe right and moderate to moderately severe left foraminal narrowing C6-7 due to uncovertebral disease.

## 2017-11-30 ENCOUNTER — Telehealth (INDEPENDENT_AMBULATORY_CARE_PROVIDER_SITE_OTHER): Payer: Self-pay | Admitting: Physical Medicine and Rehabilitation

## 2017-11-30 NOTE — Telephone Encounter (Signed)
If mostly back pain then ok to try to get RFA auth - send me back a note so I can dictate a brief note based on phone call, make sure no new trauma etc

## 2017-11-30 NOTE — Telephone Encounter (Signed)
Patient states he has no leg pain since his ESI. All pain is in lower back. No new injury.

## 2017-12-02 NOTE — Telephone Encounter (Signed)
Needs auth for repeat RFA. Last done in 2016.

## 2017-12-02 NOTE — Telephone Encounter (Signed)
Please see encounter note below.

## 2017-12-02 NOTE — Telephone Encounter (Signed)
[name] is a 62 year old gentleman that I have seen over the years for both cervical and lumbar pain including some hip pain.  He calls in today reporting worsening low back pain over the last several months and really just chronic prolonged worsening to the point where he was just really putting up with it.  He still works pretty physical job as an Artist.  We have completed radiofrequency ablation on a couple of occasions with the last being in November 2016.  This was ablation of the L3-4 facet joints and L2-3 facet joints.  He reports pain in the exact same location as before with no leg pain.  He has had no trauma.  He has had no other complaints.  We are going to get preapproval for repeat radiofrequency ablation of the L2-3 and L3-4 facet joints.

## 2017-12-04 NOTE — Telephone Encounter (Signed)
Spoke to [employer] rep states no prior Berkley Harvey is required for procedure. Ref # [id number]

## 2018-01-06 ENCOUNTER — Ambulatory Visit (INDEPENDENT_AMBULATORY_CARE_PROVIDER_SITE_OTHER): Payer: BLUE CROSS/BLUE SHIELD | Admitting: Physical Medicine and Rehabilitation

## 2018-01-06 ENCOUNTER — Encounter (INDEPENDENT_AMBULATORY_CARE_PROVIDER_SITE_OTHER): Payer: Self-pay | Admitting: Physical Medicine and Rehabilitation

## 2018-01-06 VITALS — BP 141/86 | HR 52

## 2018-01-06 DIAGNOSIS — M47816 Spondylosis without myelopathy or radiculopathy, lumbar region: Secondary | ICD-10-CM | POA: Diagnosis not present

## 2018-01-06 MED ORDER — METHYLPREDNISOLONE ACETATE 80 MG/ML IJ SUSP
80.0000 mg | Freq: Once | INTRAMUSCULAR | Status: AC
  Administered 2018-01-06: 80 mg

## 2018-01-06 NOTE — Progress Notes (Signed)
 .  Numeric Pain Rating Scale and Functional Assessment Average Pain 8   In the last MONTH (on 0-10 scale) has pain interfered with the following?  1. General activity like being  able to carry out your everyday physical activities such as walking, climbing stairs, carrying groceries, or moving a chair?  Rating(7)   +Driver, -BT, -Dye Allergies.  

## 2018-01-06 NOTE — Patient Instructions (Signed)

## 2018-01-07 ENCOUNTER — Ambulatory Visit (INDEPENDENT_AMBULATORY_CARE_PROVIDER_SITE_OTHER): Payer: BLUE CROSS/BLUE SHIELD

## 2018-01-13 ENCOUNTER — Ambulatory Visit (INDEPENDENT_AMBULATORY_CARE_PROVIDER_SITE_OTHER): Payer: Self-pay

## 2018-01-13 ENCOUNTER — Encounter (INDEPENDENT_AMBULATORY_CARE_PROVIDER_SITE_OTHER): Payer: Self-pay | Admitting: Physical Medicine and Rehabilitation

## 2018-01-13 ENCOUNTER — Ambulatory Visit (INDEPENDENT_AMBULATORY_CARE_PROVIDER_SITE_OTHER): Payer: BLUE CROSS/BLUE SHIELD | Admitting: Physical Medicine and Rehabilitation

## 2018-01-13 VITALS — BP 109/65 | HR 58

## 2018-01-13 DIAGNOSIS — M47816 Spondylosis without myelopathy or radiculopathy, lumbar region: Secondary | ICD-10-CM

## 2018-01-13 MED ORDER — METHYLPREDNISOLONE ACETATE 80 MG/ML IJ SUSP
80.0000 mg | Freq: Once | INTRAMUSCULAR | Status: AC
  Administered 2018-01-13: 80 mg

## 2018-01-13 NOTE — Procedures (Signed)
Lumbar Facet Joint Nerve Denervation  Patient: Justin ChattersRandy V Saunders      Date of Birth: September 28, 1955 MRN: [id number] PCP: [name], [name], MD      Visit Date: 01/06/2018   Universal Protocol:    Date/Time: 06/26/195:52 AM  Consent Given By: the patient  Position: PRONE  Additional Comments: Vital signs were monitored before and after the procedure. Patient was prepped and draped in the usual sterile fashion. The correct patient, procedure, and site was verified.   Injection Procedure Details:  Procedure Site One Meds Administered:  Meds ordered this encounter  Medications  . methylPREDNISolone acetate (DEPO-MEDROL) injection 80 mg     Laterality: Left  Location/Site: Left L1, L2 and L3 medial branches L2-L3 L3-L4  Needle size: 18 G  Needle type: Radiofrequency cannula  Needle Placement: Along juncture of superior articular process and transverse pocess  Findings:  -Comments:  Procedure Details: For each desired target nerve, the corresponding transverse process (sacral ala for the L5 dorsal rami) was identified and the fluoroscope was positioned to square off the endplates of the corresponding vertebral body to achieve a true AP midline view.  The beam was then obliqued 15 to 20 degrees and caudally tilted 15 to 20 degrees to line up a trajectory along the target nerves. The skin over the target of the junction of superior articulating process and transverse process (sacral ala for the L5 dorsal rami) was infiltrated with 1ml of 1% Lidocaine without Epinephrine.  The 18 gauge 10mm active tip outer cannula was advanced in trajectory view to the target.  This procedure was repeated for each target nerve.  Then, for all levels, the outer cannula placement was fine-tuned and the position was then confirmed with bi-planar imaging.    Test stimulation was done both at sensory and motor levels to ensure there was no radicular stimulation. The target tissues were then infiltrated  with 1 ml of 1% Lidocaine without Epinephrine. Subsequently, a percutaneous neurotomy was carried out for 60 seconds at 80 degrees Celsius. The procedure was repeated with the cannula rotated 90 degrees, for duration of 60 seconds, one additional time at each level for a total of two lesions per level.  After the completion of the two lesions, 1 ml of injectate was delivered. It was then repeated for each facet joint nerve mentioned above. Appropriate radiographs were obtained to verify the probe placement during the neurotomy.   Additional Comments:  The patient tolerated the procedure well Dressing: Band-Aid    Post-procedure details: Patient was observed during the procedure. Post-procedure instructions were reviewed.  Patient left the clinic in stable condition.

## 2018-01-13 NOTE — Patient Instructions (Signed)

## 2018-01-13 NOTE — Progress Notes (Signed)
  Numeric Pain Rating Scale and Functional Assessment Average Pain 3   In the last MONTH (on 0-10 scale) has pain interfered with the following?  1. General activity like being  able to carry out your everyday physical activities such as walking, climbing stairs, carrying groceries, or moving a chair?  Rating(0)   +Driver, -BT, -Dye Allergies. 

## 2018-01-13 NOTE — Progress Notes (Signed)
Justin Saunders - 62 y.o. male MRN [id number]  Date of birth: [birth year]  Office Visit Note: Visit Date: 01/06/2018 PCP: Karle Plumber, MD Referred by: Karle Plumber, MD  Subjective: Chief Complaint  Patient presents with  . Lower Back - Pain   HPI: Justin Saunders is a 62 year old gentleman that I have seen him infrequent but numerous occasions for chronic back pain and and random hip and leg pain.  He comes in today with a history of several months of worsening upper lumbar spine pain which is axial and worse with standing.  On exam he is worse with standing and extension and rotation which is concordant for his back pain and facet joint loading.  He has a history of prior radiofrequency ablation of the L2-3 and L3-4 facet joints which is around a significant degenerative disc.  He had gotten significant relief with the prior radiofrequency ablations.  He has more problems on the left than right but bilateral.  We are going to repeat the left-sided radiofrequency ablation of the L2-3 and L3-4 facet joint.  Again this is a repeat ablation and he had his last ablation almost 2 years ago and it was very successful.  In fact he says the only thing that has helped his back has been the ablations.  He randomly gets more of a radicular type pain on the left side and we randomly do an L4 transforaminal injection with relief and he has not had leg pain in quite some time.  He has no nerve compression on MRI.  He has a very physical job as an Artist.  He has had no new trauma.   ROS Otherwise per HPI.  Assessment & Plan: Visit Diagnoses:  1. Spondylosis without myelopathy or radiculopathy, lumbar region     Plan: No additional findings.   Meds & Orders:  Meds ordered this encounter  Medications  . methylPREDNISolone acetate (DEPO-MEDROL) injection 80 mg    Orders Placed This Encounter  Procedures  . Radiofrequency,Lumbar  . XR C-ARM NO REPORT    Follow-up: Return if symptoms worsen or  fail to improve.   Procedures: No procedures performed  Lumbar Facet Joint Nerve Denervation  Patient: Justin Saunders      Date of Birth: 15-Sep-1955 MRN: [id number] PCP: Karle Plumber, MD      Visit Date: 01/06/2018   Universal Protocol:    Date/Time: 06/26/195:52 AM  Consent Given By: the patient  Position: PRONE  Additional Comments: Vital signs were monitored before and after the procedure. Patient was prepped and draped in the usual sterile fashion. The correct patient, procedure, and site was verified.   Injection Procedure Details:  Procedure Site One Meds Administered:  Meds ordered this encounter  Medications  . methylPREDNISolone acetate (DEPO-MEDROL) injection 80 mg     Laterality: Left  Location/Site: Left L1, L2 and L3 medial branches L2-L3 L3-L4  Needle size: 18 G  Needle type: Radiofrequency cannula  Needle Placement: Along juncture of superior articular process and transverse pocess  Findings:  -Comments:  Procedure Details: For each desired target nerve, the corresponding transverse process (sacral ala for the L5 dorsal rami) was identified and the fluoroscope was positioned to square off the endplates of the corresponding vertebral body to achieve a true AP midline view.  The beam was then obliqued 15 to 20 degrees and caudally tilted 15 to 20 degrees to line up a trajectory along the target nerves. The skin over the target of the  junction of superior articulating process and transverse process (sacral ala for the L5 dorsal rami) was infiltrated with 1ml of 1% Lidocaine without Epinephrine.  The 18 gauge 10mm active tip outer cannula was advanced in trajectory view to the target.  This procedure was repeated for each target nerve.  Then, for all levels, the outer cannula placement was fine-tuned and the position was then confirmed with bi-planar imaging.    Test stimulation was done both at sensory and motor levels to ensure there was no  radicular stimulation. The target tissues were then infiltrated with 1 ml of 1% Lidocaine without Epinephrine. Subsequently, a percutaneous neurotomy was carried out for 60 seconds at 80 degrees Celsius. The procedure was repeated with the cannula rotated 90 degrees, for duration of 60 seconds, one additional time at each level for a total of two lesions per level.  After the completion of the two lesions, 1 ml of injectate was delivered. It was then repeated for each facet joint nerve mentioned above. Appropriate radiographs were obtained to verify the probe placement during the neurotomy.   Additional Comments:  The patient tolerated the procedure well Dressing: Band-Aid    Post-procedure details: Patient was observed during the procedure. Post-procedure instructions were reviewed.  Patient left the clinic in stable condition.      Clinical History: Lumbar spine MRI dated 07/22/2016  1. No notable change compared to 2014. 2. L2-3 advanced degenerative disc narrowing with borderline moderate left foraminal stenosis. 3. L3-4 to L5-S1 disc bulging with noncompressive bilateral foraminal narrowing.  Cervical spine MRI10/15/2016  IMPRESSION: Severe right foraminal narrowing at C5-6 due to uncovertebral spurring. There is mild deformity of the ventral right hemicord at this level due to a disc osteophyte complex to the right.  Severe right and moderate to moderately severe left foraminal narrowing C6-7 due to uncovertebral disease.   He reports that he has been smoking cigarettes.  He has a 42.00 pack-year smoking history. He has never used smokeless tobacco. No results for input(s): HGBA1C, LABURIC in the last 8760 hours.  Objective:  VS:  HT:    WT:   BMI:     BP:(!) 141/86  HR:(!) 52bpm  TEMP: ( )  RESP:  Physical Exam  Constitutional: He is oriented to person, place, and time.  Musculoskeletal:  Patient has pain on extension rotation of the lumbar spine which is  concordant for his low back pain.  He has some paraspinal tenderness with myofascial type trigger points but not reproducible of his pain.  He has mild pain over the greater trochanters.  He has no pain with hip rotation he has good distal strength.  Neurological: He is alert and oriented to person, place, and time. He exhibits normal muscle tone. Coordination normal.    Ortho Exam Imaging: No results found.  Past Medical/Family/Surgical/Social History: Medications & Allergies reviewed per EMR, new medications updated. Patient Active Problem List   Diagnosis Date Noted  . Perennial allergic rhinitis 01/16/2016  . Chronic sinusitis 01/16/2016  . Tobacco use 01/16/2016   Past Medical History:  Diagnosis Date  . Allergic rhinitis   . Back pain   . DDD (degenerative disc disease), cervical    thoracic and lumbar  . Heart disease   . Hyperlipidemia   . Hypertension   . Hypothyroidism   . Panic attacks   . Sinusitis    Family History  Problem Relation Age of Onset  . Allergic rhinitis Neg Hx   . Angioedema Neg Hx   .  Asthma Neg Hx   . Eczema Neg Hx   . Immunodeficiency Neg Hx   . Urticaria Neg Hx    Past Surgical History:  Procedure Laterality Date  . APPENDECTOMY  1974  . CHOLECYSTECTOMY  2012  . epideral steroid injections     in cervical,thoracic and lumbar  . stents  [id number]   3 heart stents 1st surgery,then 1 stent 2cd surgery  . TONSILLECTOMY     Social History   Occupational History  . Not on file  Tobacco Use  . Smoking status: Current Every Day Smoker    Packs/day: 1.00    Years: 42.00    Pack years: 42.00    Types: Cigarettes  . Smokeless tobacco: Never Used  Substance and Sexual Activity  . Alcohol use: No  . Drug use: No  . Sexual activity: Not on file

## 2018-01-15 NOTE — Procedures (Signed)
Lumbar Facet Joint Nerve Denervation  Patient: Justin ChattersRandy V Montville      Date of Birth: 1955-09-16 MRN: [id number] PCP: [name], [name], MD      Visit Date: 01/13/2018   Universal Protocol:    Date/Time: 06/28/193:58 PM  Consent Given By: the patient  Position: PRONE  Additional Comments: Vital signs were monitored before and after the procedure. Patient was prepped and draped in the usual sterile fashion. The correct patient, procedure, and site was verified.   Injection Procedure Details:  Procedure Site One Meds Administered:  Meds ordered this encounter  Medications  . methylPREDNISolone acetate (DEPO-MEDROL) injection 80 mg     Laterality: Right  Location/Site:  L2-L3 L3-L4  Needle size: 18 G  Needle type: Radiofrequency cannula  Needle Placement: Along juncture of superior articular process and transverse pocess  Findings:  -Comments:  Procedure Details: For each desired target nerve, the corresponding transverse process (sacral ala for the L5 dorsal rami) was identified and the fluoroscope was positioned to square off the endplates of the corresponding vertebral body to achieve a true AP midline view.  The beam was then obliqued 15 to 20 degrees and caudally tilted 15 to 20 degrees to line up a trajectory along the target nerves. The skin over the target of the junction of superior articulating process and transverse process (sacral ala for the L5 dorsal rami) was infiltrated with 1ml of 1% Lidocaine without Epinephrine.  The 18 gauge 10mm active tip outer cannula was advanced in trajectory view to the target.  This procedure was repeated for each target nerve.  Then, for all levels, the outer cannula placement was fine-tuned and the position was then confirmed with bi-planar imaging.    Test stimulation was done both at sensory and motor levels to ensure there was no radicular stimulation. The target tissues were then infiltrated with 1 ml of 1% Lidocaine without  Epinephrine. Subsequently, a percutaneous neurotomy was carried out for 60 seconds at 80 degrees Celsius. The procedure was repeated with the cannula rotated 90 degrees, for duration of 60 seconds, one additional time at each level for a total of two lesions per level.  After the completion of the two lesions, 1 ml of injectate was delivered. It was then repeated for each facet joint nerve mentioned above. Appropriate radiographs were obtained to verify the probe placement during the neurotomy.   Additional Comments:  The patient tolerated the procedure well Dressing: Band-Aid    Post-procedure details: Patient was observed during the procedure. Post-procedure instructions were reviewed.  Patient left the clinic in stable condition.

## 2018-01-15 NOTE — Progress Notes (Signed)
Justin Saunders - 62 y.o. male MRN [id number]  Date of birth: 08-22-1955  Office Visit Note: Visit Date: 01/13/2018 PCP: Karle Plumber, MD Referred by: Karle Plumber, MD  Subjective: Chief Complaint  Patient presents with  . Lower Back - Pain   HPI: Justin Saunders is a 62 year old gentleman that I have seen him infrequent but numerous occasions for chronic back pain and and random hip and leg pain.  He comes in today with a history of several months of worsening upper lumbar spine pain which is axial and worse with standing.  On exam he is worse with standing and extension and rotation which is concordant for his back pain and facet joint loading.  He has a history of prior radiofrequency ablation of the L2-3 and L3-4 facet joints which is around a significant degenerative disc.  He had gotten significant relief with the prior radiofrequency ablations.  He has more problems on the left than right but bilateral.  We successfully completed left-sided ablation a week or 2 ago and he is done well with that.  We are going to the right side today.      ROS Otherwise per HPI.  Assessment & Plan: Visit Diagnoses:  1. Spondylosis without myelopathy or radiculopathy, lumbar region     Plan: No additional findings.   Meds & Orders:  Meds ordered this encounter  Medications  . methylPREDNISolone acetate (DEPO-MEDROL) injection 80 mg    Orders Placed This Encounter  Procedures  . Radiofrequency,Lumbar  . XR C-ARM NO REPORT    Follow-up: Return if symptoms worsen or fail to improve.   Procedures: No procedures performed  Lumbar Facet Joint Nerve Denervation  Patient: Justin Saunders      Date of Birth: 21-Jul-1956 MRN: [id number] PCP: Karle Plumber, MD      Visit Date: 01/13/2018   Universal Protocol:    Date/Time: 06/28/193:58 PM  Consent Given By: the patient  Position: PRONE  Additional Comments: Vital signs were monitored before and after the procedure. Patient was  prepped and draped in the usual sterile fashion. The correct patient, procedure, and site was verified.   Injection Procedure Details:  Procedure Site One Meds Administered:  Meds ordered this encounter  Medications  . methylPREDNISolone acetate (DEPO-MEDROL) injection 80 mg     Laterality: Right  Location/Site:  L2-L3 L3-L4  Needle size: 18 G  Needle type: Radiofrequency cannula  Needle Placement: Along juncture of superior articular process and transverse pocess  Findings:  -Comments:  Procedure Details: For each desired target nerve, the corresponding transverse process (sacral ala for the L5 dorsal rami) was identified and the fluoroscope was positioned to square off the endplates of the corresponding vertebral body to achieve a true AP midline view.  The beam was then obliqued 15 to 20 degrees and caudally tilted 15 to 20 degrees to line up a trajectory along the target nerves. The skin over the target of the junction of superior articulating process and transverse process (sacral ala for the L5 dorsal rami) was infiltrated with 1ml of 1% Lidocaine without Epinephrine.  The 18 gauge 10mm active tip outer cannula was advanced in trajectory view to the target.  This procedure was repeated for each target nerve.  Then, for all levels, the outer cannula placement was fine-tuned and the position was then confirmed with bi-planar imaging.    Test stimulation was done both at sensory and motor levels to ensure there was no radicular stimulation. The target  tissues were then infiltrated with 1 ml of 1% Lidocaine without Epinephrine. Subsequently, a percutaneous neurotomy was carried out for 60 seconds at 80 degrees Celsius. The procedure was repeated with the cannula rotated 90 degrees, for duration of 60 seconds, one additional time at each level for a total of two lesions per level.  After the completion of the two lesions, 1 ml of injectate was delivered. It was then repeated for each  facet joint nerve mentioned above. Appropriate radiographs were obtained to verify the probe placement during the neurotomy.   Additional Comments:  The patient tolerated the procedure well Dressing: Band-Aid    Post-procedure details: Patient was observed during the procedure. Post-procedure instructions were reviewed.  Patient left the clinic in stable condition.      Clinical History: Lumbar spine MRI dated 07/22/2016  1. No notable change compared to 2014. 2. L2-3 advanced degenerative disc narrowing with borderline moderate left foraminal stenosis. 3. L3-4 to L5-S1 disc bulging with noncompressive bilateral foraminal narrowing.  Cervical spine MRI10/15/2016  IMPRESSION: Severe right foraminal narrowing at C5-6 due to uncovertebral spurring. There is mild deformity of the ventral right hemicord at this level due to a disc osteophyte complex to the right.  Severe right and moderate to moderately severe left foraminal narrowing C6-7 due to uncovertebral disease.   He reports that he has been smoking cigarettes.  He has a 42.00 pack-year smoking history. He has never used smokeless tobacco. No results for input(s): HGBA1C, LABURIC in the last 8760 hours.  Objective:  VS:  HT:    WT:   BMI:     BP:109/65  HR:(!) 58bpm  TEMP: ( )  RESP:  Physical Exam  Ortho Exam Imaging: No results found.  Past Medical/Family/Surgical/Social History: Medications & Allergies reviewed per EMR, new medications updated. Patient Active Problem List   Diagnosis Date Noted  . Perennial allergic rhinitis 01/16/2016  . Chronic sinusitis 01/16/2016  . Tobacco use 01/16/2016   Past Medical History:  Diagnosis Date  . Allergic rhinitis   . Back pain   . DDD (degenerative disc disease), cervical    thoracic and lumbar  . Heart disease   . Hyperlipidemia   . Hypertension   . Hypothyroidism   . Panic attacks   . Sinusitis    Family History  Problem Relation Age of Onset  .  Allergic rhinitis Neg Hx   . Angioedema Neg Hx   . Asthma Neg Hx   . Eczema Neg Hx   . Immunodeficiency Neg Hx   . Urticaria Neg Hx    Past Surgical History:  Procedure Laterality Date  . APPENDECTOMY  1974  . CHOLECYSTECTOMY  2012  . epideral steroid injections     in cervical,thoracic and lumbar  . stents  [id number],2009   3 heart stents 1st surgery,then 1 stent 2cd surgery  . TONSILLECTOMY     Social History   Occupational History  . Not on file  Tobacco Use  . Smoking status: Current Every Day Smoker    Packs/day: 1.00    Years: 42.00    Pack years: 42.00    Types: Cigarettes  . Smokeless tobacco: Never Used  Substance and Sexual Activity  . Alcohol use: No  . Drug use: No  . Sexual activity: Not on file

## 2018-05-13 ENCOUNTER — Telehealth (INDEPENDENT_AMBULATORY_CARE_PROVIDER_SITE_OTHER): Payer: Self-pay | Admitting: Physical Medicine and Rehabilitation

## 2018-05-13 ENCOUNTER — Other Ambulatory Visit (INDEPENDENT_AMBULATORY_CARE_PROVIDER_SITE_OTHER): Payer: Self-pay | Admitting: Physical Medicine and Rehabilitation

## 2018-05-13 DIAGNOSIS — M545 Low back pain, unspecified: Secondary | ICD-10-CM

## 2018-05-13 DIAGNOSIS — G8929 Other chronic pain: Secondary | ICD-10-CM

## 2018-05-13 MED ORDER — PREDNISONE 50 MG PO TABS: ORAL_TABLET | ORAL | 0 refills | Status: DC

## 2018-05-13 NOTE — Progress Notes (Signed)
Patient having right low back and hip pain, Pain was present priot to RFA, RFA not helpful. Nurse at work suggested trial of oral prednisone. I did send in Rx. If no better in two weeks will follow up.

## 2018-05-13 NOTE — Telephone Encounter (Signed)
Rx sent in to Pharmacy on record. If no better in 2 weeks or so will need OV for low back hip evaluation

## 2018-05-13 NOTE — Telephone Encounter (Signed)
Patient notified

## 2018-05-25 ENCOUNTER — Ambulatory Visit (INDEPENDENT_AMBULATORY_CARE_PROVIDER_SITE_OTHER): Payer: BLUE CROSS/BLUE SHIELD | Admitting: Physical Medicine and Rehabilitation

## 2018-05-25 ENCOUNTER — Encounter (INDEPENDENT_AMBULATORY_CARE_PROVIDER_SITE_OTHER): Payer: Self-pay | Admitting: Physical Medicine and Rehabilitation

## 2018-05-25 ENCOUNTER — Ambulatory Visit (INDEPENDENT_AMBULATORY_CARE_PROVIDER_SITE_OTHER): Payer: Self-pay

## 2018-05-25 VITALS — BP 124/79 | HR 57 | Ht 65.0 in | Wt 125.0 lb

## 2018-05-25 DIAGNOSIS — M545 Low back pain, unspecified: Secondary | ICD-10-CM

## 2018-05-25 DIAGNOSIS — G8929 Other chronic pain: Secondary | ICD-10-CM

## 2018-05-25 DIAGNOSIS — M25551 Pain in right hip: Secondary | ICD-10-CM | POA: Diagnosis not present

## 2018-05-25 DIAGNOSIS — W19XXXA Unspecified fall, initial encounter: Secondary | ICD-10-CM

## 2018-05-25 DIAGNOSIS — M47816 Spondylosis without myelopathy or radiculopathy, lumbar region: Secondary | ICD-10-CM

## 2018-05-25 NOTE — Progress Notes (Signed)
 .  Numeric Pain Rating Scale and Functional Assessment Average Pain 6 Pain Right Now 4 My pain is constant, stabbing and aching Pain is worse with: some activites Pain improves with: medication   In the last MONTH (on 0-10 scale) has pain interfered with the following?  1. General activity like being  able to carry out your everyday physical activities such as walking, climbing stairs, carrying groceries, or moving a chair?  Rating(6)  2. Relation with others like being able to carry out your usual social activities and roles such as  activities at home, at work and in your community. Rating(6)  3. Enjoyment of life such that you have  been bothered by emotional problems such as feeling anxious, depressed or irritable?  Rating(3)

## 2018-06-03 DIAGNOSIS — M545 Low back pain: Secondary | ICD-10-CM

## 2018-06-03 MED ORDER — LIDOCAINE HCL (PF) 1 % IJ SOLN
3.0000 mL | INTRAMUSCULAR | Status: AC | PRN
  Administered 2018-06-03: 3 mL

## 2018-06-03 MED ORDER — TRIAMCINOLONE ACETONIDE 40 MG/ML IJ SUSP
40.0000 mg | INTRAMUSCULAR | Status: AC | PRN
  Administered 2018-06-03: 40 mg via INTRAMUSCULAR

## 2018-06-03 NOTE — Progress Notes (Signed)
Justin Saunders - 62 y.o. male MRN [id number]  Date of birth: Jan 29, 1956  Office Visit Note: Visit Date: 05/25/2018 PCP: Karle Plumber, MD Referred by: Karle Plumber, MD  Subjective: No chief complaint on file.  HPI: Justin Saunders is a 62 y.o. male who comes in today For evaluation and management of acute on chronic right lower back and buttock pain.  He reports that he has had 2 falls over the last 6 weeks.  He does not report any neurologic symptoms or blacking out or dizziness.  He had 2 situations where he tripped and fell.  He has had ongoing pain since that time the right lower back and buttock region.  He points to an area over the PSIS of a little bit superior to that over the iliac crest.  Patient is well-known to [place] with a history of chronic low back pain that does well with radiofrequency ablation of the upper lumbar lower thoracic region.  He has had issues at L2-3 with likely remote disc herniation and degenerative changes here.  He also has facet arthropathy at L5-S1 in particular with by foraminal narrowing.  Lajoyce Corners the fact that he had ongoing falls we did obtain x-ray imaging of the lumbar and pelvis.  He does note some anterior pain on the right but not specifically in the groin.  Worse with movement.  He has no radicular symptoms down the legs.  No focal weakness no bowel bladder difficulty or changes.  He has been using some medication with mild relief he has got a little bit better over time but not a significant amount of pain relief.  He is concerned as he felt like his right leg just gave out.  He has been taking again some prednisone with some relief while he took it.  Review of Systems  Constitutional: Negative for chills, fever, malaise/fatigue and weight loss.  HENT: Negative for hearing loss and sinus pain.   Eyes: Negative for blurred vision, double vision and photophobia.  Respiratory: Negative for cough and shortness of breath.   Cardiovascular: Negative for  chest pain, palpitations and leg swelling.  Gastrointestinal: Negative for abdominal pain, nausea and vomiting.  Genitourinary: Negative for flank pain.  Musculoskeletal: Positive for back pain and joint pain. Negative for myalgias.  Skin: Negative for itching and rash.  Neurological: Negative for tremors, focal weakness and weakness.  Endo/Heme/Allergies: Negative.   Psychiatric/Behavioral: Negative for depression.  All other systems reviewed and are negative.  Otherwise per HPI.  Assessment & Plan: Visit Diagnoses:  1. Pain in right hip   2. Spondylosis without myelopathy or radiculopathy, lumbar region   3. Acute right-sided low back pain without sciatica   4. Chronic bilateral low back pain without sciatica   5. Fall, initial encounter     Plan: Findings:  Chronic history of mechanical back pain with history of radiofrequency ablation particularly upper lumbar region but now is having pain really more over the right lower back lumbosacral junction what he refers to as the buttocks.  He does have some referral laterally and anteriorly.  On exam he has very stiff right hip when he rotated but it does not really reproduce a lot of his pain.  He has focal tenderness over the musculature right at the iliac crest.  He has good strength otherwise and no red flag complaints.  I think this is a lumbar sprain strain type of activity around this area but could be related to foraminal stenosis  at L5-S1 causing the L5 radiculitis.  He is failed conservative care with oral prednisone and stretching and activity modification.  The next step I think would be today to complete muscular tendon injection around this area over the iliac crest.  And then we will schedule him for L5 transforaminal injection diagnostically if he does not get relief with the local injection.  Would entertain the idea of the cluneal nerve entrapment.  Would also entertain the idea of piriformis syndrome.    Meds & Orders: No  orders of the defined types were placed in this encounter.   Orders Placed This Encounter  Procedures  . Trigger Point Inj  . XR HIP UNILAT W OR W/O PELVIS 1V RIGHT  . XR Lumbar Spine 2-3 Views    Follow-up: Return for Potential right L5 transforaminal injection..   Procedures: Trigger Point Inj Date/Time: 06/03/2018 6:12 AM Performed by: Tyrell AntonioNewton, Kalesha Irving, MD Authorized by: Tyrell AntonioNewton, Dontavius Keim, MD   Consent Given by:  Patient Site marked: the procedure site was marked   Timeout: prior to procedure the correct patient, procedure, and site was verified   Indications:  Pain Total # of Trigger Points:  3 or more Location: back   Needle Size:  25 G Approach:  Dorsal and lateral Medications #1:  40 mg triamcinolone acetonide 40 MG/ML; 3 mL lidocaine (PF) 1 % Patient tolerance:  Patient tolerated the procedure well with no immediate complications Comments: Trigger points were identified in the multifidus and paraspinal as well as quadratus lumborum.    No notes on file   Clinical History: Lumbar spine MRI dated 07/22/2016  1. No notable change compared to 2014. 2. L2-3 advanced degenerative disc narrowing with borderline moderate left foraminal stenosis. 3. L3-4 to L5-S1 disc bulging with noncompressive bilateral foraminal narrowing.  Cervical spine MRI10/15/2016  IMPRESSION: Severe right foraminal narrowing at C5-6 due to uncovertebral spurring. There is mild deformity of the ventral right hemicord at this level due to a disc osteophyte complex to the right.  Severe right and moderate to moderately severe left foraminal narrowing C6-7 due to uncovertebral disease.   He reports that he has been smoking cigarettes. He has a 42.00 pack-year smoking history. He has never used smokeless tobacco. No results for input(s): HGBA1C, LABURIC in the last 8760 hours.  Objective:  VS:  HT:5\' 5"  (165.1 cm)   WT:125 lb (56.7 kg)  BMI:20.8    BP:124/79  HR:(!) 57bpm  TEMP: ( )  RESP:    Physical Exam  Constitutional: He is oriented to person, place, and time. He appears well-developed and well-nourished. No distress.  HENT:  Head: Normocephalic and atraumatic.  Eyes: Pupils are equal, round, and reactive to light. Conjunctivae are normal.  Neck: Normal range of motion. Neck supple.  Cardiovascular: Regular rhythm and intact distal pulses.  Pulmonary/Chest: Effort normal. No respiratory distress.  Musculoskeletal:  Patient ambulates without aid with a slightly antalgic gait to the right.  He has trouble going from sit to full extension.  He has pain with extension of the lumbar spine and facet loading.  He does have focal tenderness over the quadratus lumborum and iliac crest area.  This does reproduce a lot of his pain.  No pain over the greater trochanter.  He has no groin pain with internal rotation of both hips although he is stiff on the right with movement.  He has good distal strength.  Neurological: He is alert and oriented to person, place, and time. He exhibits normal muscle  tone. Coordination normal.  Skin: Skin is warm and dry. No rash noted. No erythema.  Psychiatric: He has a normal mood and affect.  Nursing note and vitals reviewed.   Ortho Exam Imaging: AP and lateral lumbar spine show normal anatomic alignment AP with lateral view showing facet arthropathy at L4-5 and L5-S1 with by foraminal narrowing at L5-S1.  There is degenerative disc height loss at L2-3 with small retrolisthesis and foraminal narrowing here as well.  No fractures or dislocations.  AP of the pelvis and right hip shows moderate osteoarthritis of the right hip compared to left with no fractures or dislocations.  Sacroiliac joints are well-maintained.  There is degenerative changes of the lumbar spine   Past Medical/Family/Surgical/Social History: Medications & Allergies reviewed per EMR, new medications updated. Patient Active Problem List   Diagnosis Date Noted  . Perennial allergic  rhinitis 01/16/2016  . Chronic sinusitis 01/16/2016  . Tobacco use 01/16/2016   Past Medical History:  Diagnosis Date  . Allergic rhinitis   . Back pain   . DDD (degenerative disc disease), cervical    thoracic and lumbar  . Heart disease   . Hyperlipidemia   . Hypertension   . Hypothyroidism   . Panic attacks   . Sinusitis    Family History  Problem Relation Age of Onset  . Allergic rhinitis Neg Hx   . Angioedema Neg Hx   . Asthma Neg Hx   . Eczema Neg Hx   . Immunodeficiency Neg Hx   . Urticaria Neg Hx    Past Surgical History:  Procedure Laterality Date  . APPENDECTOMY  1974  . CHOLECYSTECTOMY  2012  . epideral steroid injections     in cervical,thoracic and lumbar  . stents  1610,9604   3 heart stents 1st surgery,then 1 stent 2cd surgery  . TONSILLECTOMY     Social History   Occupational History  . Not on file  Tobacco Use  . Smoking status: Current Every Day Smoker    Packs/day: 1.00    Years: 42.00    Pack years: 42.00    Types: Cigarettes  . Smokeless tobacco: Never Used  Substance and Sexual Activity  . Alcohol use: No  . Drug use: No  . Sexual activity: Not on file

## 2018-06-04 ENCOUNTER — Telehealth (INDEPENDENT_AMBULATORY_CARE_PROVIDER_SITE_OTHER): Payer: Self-pay | Admitting: Physical Medicine and Rehabilitation

## 2018-06-23 NOTE — Telephone Encounter (Signed)
Closing encounter

## 2018-08-09 ENCOUNTER — Telehealth (INDEPENDENT_AMBULATORY_CARE_PROVIDER_SITE_OTHER): Payer: Self-pay | Admitting: Physical Medicine and Rehabilitation

## 2018-08-09 MED ORDER — PREDNISONE 50 MG PO TABS: ORAL_TABLET | ORAL | 0 refills | Status: DC

## 2018-08-09 MED ORDER — METHOCARBAMOL 500 MG PO TABS: 500.0000 mg | ORAL_TABLET | Freq: Three times a day (TID) | ORAL | 0 refills | Status: DC | PRN

## 2018-08-09 NOTE — Telephone Encounter (Signed)
See my documentation

## 2018-08-09 NOTE — Telephone Encounter (Signed)
Acute flare up of back pain. Prednisone, muscle relaxer, ICE/Heat - can see Chiropractor. Follow Prednisone with 800mg  Ibuprofen three times per day for next two weeks. If not getting better we can try to see for possible epidural.

## 2018-08-09 NOTE — Telephone Encounter (Signed)
Called patient to advise. He states that he is having pain on the left side, lateral hip, buttock after playing ping pong Friday night. No groin pain. He states that the pain has started coming on gradually, but since Friday he cannot lie down. He wants to know if he can schedule an injection.He states that the right side feels good since the last injection on that side. Please advise.

## 2018-08-09 NOTE — Telephone Encounter (Signed)
That is fine but see where he can come in

## 2018-08-10 NOTE — Telephone Encounter (Signed)
Scheduled for 08/19/18 at 0830.

## 2018-08-19 ENCOUNTER — Encounter

## 2018-08-19 ENCOUNTER — Ambulatory Visit (INDEPENDENT_AMBULATORY_CARE_PROVIDER_SITE_OTHER): Payer: BLUE CROSS/BLUE SHIELD | Admitting: Physical Medicine and Rehabilitation

## 2018-08-19 ENCOUNTER — Encounter (INDEPENDENT_AMBULATORY_CARE_PROVIDER_SITE_OTHER): Payer: Self-pay | Admitting: Physical Medicine and Rehabilitation

## 2018-08-19 DIAGNOSIS — M545 Low back pain, unspecified: Secondary | ICD-10-CM

## 2018-08-19 DIAGNOSIS — S39012A Strain of muscle, fascia and tendon of lower back, initial encounter: Secondary | ICD-10-CM

## 2018-08-19 DIAGNOSIS — M47816 Spondylosis without myelopathy or radiculopathy, lumbar region: Secondary | ICD-10-CM | POA: Diagnosis not present

## 2018-08-19 NOTE — Progress Notes (Signed)
Justin ChattersRandy V Saunders - 63 y.o. male MRN [id number]  Date of birth: 1956-04-29  Office Visit Note: Visit Date: 08/19/2018 PCP: [name], [name], MD Referred by: [name], [name], MD  Subjective: Chief Complaint  Patient presents with  . Lower Back - Pain   HPI: Justin ChattersRandy V Saunders is a 63 y.o. male who comes in today For evaluation management of acute on chronic low back pain.  Patient has been seen in the office on a few occasions for lumbar radiofrequency ablation and history of at least lumbar epidural injection at one point.  He has chronic low back pain with degenerative facet arthropathy and some lateral recess narrowing.  He tells me that a couple of weeks ago he was playing table tennis which he has a history of playing for many years and was actually a competitive player but has not really played that much recently and he thinks maybe during that game he strained his back.  He did not hurt immediately but a few days later he was in severe severe pain.  The pain is on the left side paraspinal somewhat over the PSIS area.  He does not endorse any pain down the legs or paresthesias or focal weakness or bowel bladder difficulty.  He reports the pain was so intense that he really could not even go to work.  He works as a Probation officerupholsterer.  We did send him in some prednisone which she did feel like it started to help but he ultimately had to go see his primary doctor and received a shot of Toradol that did calm things down at least temporarily.  He reports now difficulty in going from sit to stand and twisting.  Review of Systems  Constitutional: Negative for chills, fever, malaise/fatigue and weight loss.  HENT: Negative for hearing loss and sinus pain.   Eyes: Negative for blurred vision, double vision and photophobia.  Respiratory: Negative for cough and shortness of breath.   Cardiovascular: Negative for chest pain, palpitations and leg swelling.  Gastrointestinal: Negative for abdominal pain, nausea  and vomiting.  Genitourinary: Negative for flank pain.  Musculoskeletal: Positive for back pain. Negative for myalgias.  Skin: Negative for itching and rash.  Neurological: Negative for tremors, focal weakness and weakness.  Endo/Heme/Allergies: Negative.   Psychiatric/Behavioral: Negative for depression.  All other systems reviewed and are negative.  Otherwise per HPI.  Assessment & Plan: Visit Diagnoses:  1. Strain of lumbar region, initial encounter   2. Spondylosis without myelopathy or radiculopathy, lumbar region   3. Acute left-sided low back pain without sciatica     Plan: Findings:  Lumbar sprain strain on top of chronic low back pain mostly related to facet arthropathy and myofascial pain.  Patient does have tenderness over the quadratus lumborum and musculature over the piriformis.  This could be an early sign of a radicular pain just not going down the leg.  He is somewhat better than he was when he started a few weeks ago.  The best approach is muscle/tendon sheath injection around the piriformis and this was done locally today.  He will continue with activity modification and home exercises.  He has been with physical therapy and chiropractic care for some years and he will continue with that.  We will see him back if needed in a few weeks.    Meds & Orders: No orders of the defined types were placed in this encounter.   Orders Placed This Encounter  Procedures  . Trigger Point  Inj    Follow-up: No follow-ups on file.   Procedures: Trigger Point Inj Date/Time: 08/19/2018 9:00 AM Performed by: Tyrell AntonioNewton, Pailyn Bellevue, MD Authorized by: Tyrell AntonioNewton, Tymar Polyak, MD   Consent Given by:  Patient Site marked: the procedure site was marked   Timeout: prior to procedure the correct patient, procedure, and site was verified   Indications:  Pain Total # of Trigger Points:  1 Location: back   Needle Size:  25 G Approach:  Dorsal and lateral Medications #1:  40 mg triamcinolone acetonide  40 MG/ML; 3 mL lidocaine (PF) 1 % Patient tolerance:  Patient tolerated the procedure well with no immediate complications Comments: Left piriformis, gluteus medius    No notes on file   Clinical History: Lumbar spine MRI dated 07/22/2016  1. No notable change compared to 2014. 2. L2-3 advanced degenerative disc narrowing with borderline moderate left foraminal stenosis. 3. L3-4 to L5-S1 disc bulging with noncompressive bilateral foraminal narrowing.  Cervical spine MRI10/15/2016  IMPRESSION: Severe right foraminal narrowing at C5-6 due to uncovertebral spurring. There is mild deformity of the ventral right hemicord at this level due to a disc osteophyte complex to the right.  Severe right and moderate to moderately severe left foraminal narrowing C6-7 due to uncovertebral disease.   He reports that he has been smoking cigarettes. He has a 42.00 pack-year smoking history. He has never used smokeless tobacco. No results for input(s): HGBA1C, LABURIC in the last 8760 hours.  Objective:  VS:  HT:    WT:   BMI:     BP:   HR: bpm  TEMP: ( )  RESP:  Physical Exam Vitals signs and nursing note reviewed.  Constitutional:      General: He is not in acute distress.    Appearance: Normal appearance. He is well-developed.  HENT:     Head: Normocephalic and atraumatic.  Eyes:     Conjunctiva/sclera: Conjunctivae normal.     Pupils: Pupils are equal, round, and reactive to light.  Neck:     Musculoskeletal: Normal range of motion and neck supple. No neck rigidity.  Cardiovascular:     Rate and Rhythm: Normal rate.     Pulses: Normal pulses.     Heart sounds: Normal heart sounds.  Pulmonary:     Effort: Pulmonary effort is normal. No respiratory distress.  Musculoskeletal:     Right lower leg: No edema.     Left lower leg: No edema.     Comments: Patient has pain going from sit to full extension and has pain with rotation.  He has pain to palpation over the piriformis and  gluteus medius muscles.  Some trigger point type taut band.  Nothing on the right.  No greater trochanteric tenderness no pain with hip rotation good distal strength.  Skin:    General: Skin is warm and dry.     Findings: No erythema or rash.  Neurological:     General: No focal deficit present.     Mental Status: He is alert and oriented to person, place, and time.     Sensory: No sensory deficit.     Coordination: Coordination normal.     Gait: Gait normal.  Psychiatric:        Mood and Affect: Mood normal.        Behavior: Behavior normal.     Ortho Exam Imaging: No results found.  Past Medical/Family/Surgical/Social History: Medications & Allergies reviewed per EMR, new medications updated. Patient Active Problem List  Diagnosis Date Noted  . Perennial allergic rhinitis 01/16/2016  . Chronic sinusitis 01/16/2016  . Tobacco use 01/16/2016   Past Medical History:  Diagnosis Date  . Allergic rhinitis   . Back pain   . DDD (degenerative disc disease), cervical    thoracic and lumbar  . Heart disease   . Hyperlipidemia   . Hypertension   . Hypothyroidism   . Panic attacks   . Sinusitis    Family History  Problem Relation Age of Onset  . Allergic rhinitis Neg Hx   . Angioedema Neg Hx   . Asthma Neg Hx   . Eczema Neg Hx   . Immunodeficiency Neg Hx   . Urticaria Neg Hx    Past Surgical History:  Procedure Laterality Date  . APPENDECTOMY  1974  . CHOLECYSTECTOMY  2012  . epideral steroid injections     in cervical,thoracic and lumbar  . stents  1062,6948   3 heart stents 1st surgery,then 1 stent 2cd surgery  . TONSILLECTOMY     Social History   Occupational History  . Not on file  Tobacco Use  . Smoking status: Current Every Day Smoker    Packs/day: 1.00    Years: 42.00    Pack years: 42.00    Types: Cigarettes  . Smokeless tobacco: Never Used  Substance and Sexual Activity  . Alcohol use: No  . Drug use: No  . Sexual activity: Not on file

## 2018-08-19 NOTE — Progress Notes (Signed)
Numeric Pain Rating Scale and Functional Assessment Average Pain 4 Pain Right Now 4 My pain is sharp stabbing pain in left lower back to left buttocks. Pain is worse with: turning towards left or laying on left side. Pain improves with: rest sitting in recliner.   In the last MONTH (on 0-10 scale) has pain interfered with the following?  1. General activity like being  able to carry out your everyday physical activities such as walking, climbing stairs, carrying groceries, or moving a chair?  Rating(4)  2. Relation with others like being able to carry out your usual social activities and roles such as  activities at home, at work and in your community. Rating(7) stayed out of work last week. Returned this week unable to lift.   3. Enjoyment of life such that you have  been bothered by emotional problems such as feeling anxious, depressed or irritable?  Rating(5) right now, last week 10.

## 2018-08-20 ENCOUNTER — Encounter (INDEPENDENT_AMBULATORY_CARE_PROVIDER_SITE_OTHER): Payer: Self-pay | Admitting: Physical Medicine and Rehabilitation

## 2018-08-20 MED ORDER — LIDOCAINE HCL (PF) 1 % IJ SOLN
3.0000 mL | INTRAMUSCULAR | Status: AC | PRN
  Administered 2018-08-19: 3 mL

## 2018-08-20 MED ORDER — TRIAMCINOLONE ACETONIDE 40 MG/ML IJ SUSP
40.0000 mg | INTRAMUSCULAR | Status: AC | PRN
  Administered 2018-08-19: 40 mg via INTRAMUSCULAR

## 2018-10-20 ENCOUNTER — Encounter (INDEPENDENT_AMBULATORY_CARE_PROVIDER_SITE_OTHER): Payer: Self-pay | Admitting: Orthopaedic Surgery

## 2018-10-20 ENCOUNTER — Ambulatory Visit (INDEPENDENT_AMBULATORY_CARE_PROVIDER_SITE_OTHER): Payer: BLUE CROSS/BLUE SHIELD | Admitting: Orthopaedic Surgery

## 2018-10-20 ENCOUNTER — Other Ambulatory Visit: Payer: Self-pay

## 2018-10-20 DIAGNOSIS — M25552 Pain in left hip: Secondary | ICD-10-CM | POA: Diagnosis not present

## 2018-10-20 DIAGNOSIS — R2242 Localized swelling, mass and lump, left lower limb: Secondary | ICD-10-CM | POA: Diagnosis not present

## 2018-10-20 NOTE — Progress Notes (Signed)
Office Visit Note   Patient: Justin Saunders           Date of Birth: 08/23/1955           MRN: [id number] Visit Date: 10/20/2018              Requested by: Karle Plumber, MD [phone] [place], [place] [address] PCP: Karle Plumber, MD   Assessment & Plan: Visit Diagnoses:  1. Pain in left hip   2. Mass of left hip region     Plan: We will obtain a MRI with and without contrast of the left hip to evaluate mass and also evaluate for hernia.  Justin Saunders will follow-up after the MRI to go over results and discuss further treatment.  Questions are encouraged and answered both by Dr. Magnus Ivan and myself.  Follow-Up Instructions: Return After MRI.   Orders:  No orders of the defined types were placed in this encounter.  No orders of the defined types were placed in this encounter.     Procedures: No procedures performed   Clinical Data: No additional findings.   Subjective: Chief Complaint  Patient presents with  . Left Hip - Pain    HPI Justin Saunders is a 63 year old male who is been seen by Dr. Alvester Morin here in the office in the past.  Justin Saunders comes in today with new complaint of left hip pain that is been ongoing for years coming worse.  Justin Saunders states that the pain typically goes away after some time however now the pain is staying constant.  Justin Saunders is only with weightbearing activities.  Justin Saunders states is very difficult to walk for the first few seconds after Justin Saunders gets up from a sitting position due to the pain in the hip.  Justin Saunders denies any radicular symptoms down the leg.  Notes a palpable nodule or mass anterior aspect of the hip and this worries pain initially started now pain radiates to the buttocks region.  Has no lateral hip pain.  Justin Saunders is tried relative rest really has not given him any relief.  Still some thought that this may be a hernia per patient and Justin Saunders saw his primary care physician but no formal work-up was done of this.  Denies any injury to the hip. Radiographs are reviewed on canopy AP  pelvis lateral view of the left hip dated 10/17/2018 shows both hips to be well located.  Mild narrowing of both hips right slightly greater than left.  No acute fractures or bony abnormalities otherwise.  Review of Systems Please see HPI  Objective: Vital Signs: There were no vitals taken for this visit.  Physical Exam Constitutional:      Appearance: Justin Saunders is normal weight. Justin Saunders is not ill-appearing or diaphoretic.  Pulmonary:     Effort: Pulmonary effort is normal.  Neurological:     Mental Status: Justin Saunders is alert and oriented to person, place, and time.  Psychiatric:        Mood and Affect: Mood normal.     Ortho Exam Bilateral hips excellent range of motion without pain.  Patient has extreme stiffness and discomfort whenever Justin Saunders first begins to get up to ambulate and takes few steps before Justin Saunders begins to walk with a normal gait.  Left hip palpable mass is slightly mobile consistent with lymph node.  Justin Saunders has tenderness over this nodule.  There is no abnormal warmth erythema or ecchymosis in this area. Specialty Comments:  No specialty comments available.  Imaging: No  results found.   PMFS History: Patient Active Problem List   Diagnosis Date Noted  . Perennial allergic rhinitis 01/16/2016  . Chronic sinusitis 01/16/2016  . Tobacco use 01/16/2016   Past Medical History:  Diagnosis Date  . Allergic rhinitis   . Back pain   . DDD (degenerative disc disease), cervical    thoracic and lumbar  . Heart disease   . Hyperlipidemia   . Hypertension   . Hypothyroidism   . Panic attacks   . Sinusitis     Family History  Problem Relation Age of Onset  . Allergic rhinitis Neg Hx   . Angioedema Neg Hx   . Asthma Neg Hx   . Eczema Neg Hx   . Immunodeficiency Neg Hx   . Urticaria Neg Hx     Past Surgical History:  Procedure Laterality Date  . APPENDECTOMY  1974  . CHOLECYSTECTOMY  2012  . epideral steroid injections     in cervical,thoracic and lumbar  . stents  3953,2023   3  heart stents 1st surgery,then 1 stent 2cd surgery  . TONSILLECTOMY     Social History   Occupational History  . Not on file  Tobacco Use  . Smoking status: Current Every Day Smoker    Packs/day: 1.00    Years: 42.00    Pack years: 42.00    Types: Cigarettes  . Smokeless tobacco: Never Used  Substance and Sexual Activity  . Alcohol use: No  . Drug use: No  . Sexual activity: Not on file

## 2018-11-18 ENCOUNTER — Telehealth (INDEPENDENT_AMBULATORY_CARE_PROVIDER_SITE_OTHER): Payer: Self-pay | Admitting: Orthopaedic Surgery

## 2018-11-18 ENCOUNTER — Other Ambulatory Visit: Payer: Self-pay | Admitting: *Deleted

## 2018-11-18 DIAGNOSIS — M25552 Pain in left hip: Secondary | ICD-10-CM

## 2018-11-18 NOTE — Telephone Encounter (Signed)
Order was not placed in [facility]que, I placed order for MRI left hip with and without contrast as urgent so pt can be seen quicker.

## 2018-11-18 NOTE — Telephone Encounter (Signed)
Patient calling to check the status of the MRI, he states the pain is very severe.

## 2019-01-04 ENCOUNTER — Telehealth: Payer: Self-pay | Admitting: *Deleted

## 2019-01-04 NOTE — Telephone Encounter (Signed)
Spoke with Glean Hess and he states no pa is needed for [phone]. Reference# [id number]

## 2019-01-25 ENCOUNTER — Encounter: Payer: Self-pay | Admitting: Physical Medicine and Rehabilitation

## 2019-01-25 ENCOUNTER — Other Ambulatory Visit: Payer: Self-pay

## 2019-01-25 ENCOUNTER — Ambulatory Visit: Payer: Self-pay

## 2019-01-25 ENCOUNTER — Ambulatory Visit (INDEPENDENT_AMBULATORY_CARE_PROVIDER_SITE_OTHER): Payer: BC Managed Care – PPO | Admitting: Physical Medicine and Rehabilitation

## 2019-01-25 ENCOUNTER — Encounter

## 2019-01-25 VITALS — BP 111/75 | HR 58

## 2019-01-25 DIAGNOSIS — M5416 Radiculopathy, lumbar region: Secondary | ICD-10-CM | POA: Diagnosis not present

## 2019-01-25 MED ORDER — BETAMETHASONE SOD PHOS & ACET 6 (3-3) MG/ML IJ SUSP
12.0000 mg | Freq: Once | INTRAMUSCULAR | Status: AC
  Administered 2019-01-25: 12 mg

## 2019-01-25 NOTE — Progress Notes (Signed)
.  Numeric Pain Rating Scale and Functional Assessment Average Pain 3   In the last MONTH (on 0-10 scale) has pain interfered with the following?  1. General activity like being  able to carry out your everyday physical activities such as walking, climbing stairs, carrying groceries, or moving a chair?  Rating(4)   +Driver, -BT, -Dye Allergies.  

## 2019-03-07 NOTE — Procedures (Signed)
Lumbar Epidural Steroid Injection - Interlaminar Approach with Fluoroscopic Guidance  Patient: Justin Saunders      Date of Birth: 1956/06/22 MRN: [id number] PCP: Guadlupe Spanish, MD      Visit Date: 01/25/2019   Universal Protocol:     Consent Given By: the patient  Position: PRONE  Additional Comments: Vital signs were monitored before and after the procedure. Patient was prepped and draped in the usual sterile fashion. The correct patient, procedure, and site was verified.   Injection Procedure Details:  Procedure Site One Meds Administered:  Meds ordered this encounter  Medications  . betamethasone acetate-betamethasone sodium phosphate (CELESTONE) injection 12 mg     Laterality: Left  Location/Site:  L5-S1  Needle size: 20 G  Needle type: Tuohy  Needle Placement: Paramedian epidural  Findings:   -Comments: Excellent flow of contrast into the epidural space.  Procedure Details: Using a paramedian approach from the side mentioned above, the region overlying the inferior lamina was localized under fluoroscopic visualization and the soft tissues overlying this structure were infiltrated with 4 ml. of 1% Lidocaine without Epinephrine. The Tuohy needle was inserted into the epidural space using a paramedian approach.   The epidural space was localized using loss of resistance along with lateral and bi-planar fluoroscopic views.  After negative aspirate for air, blood, and CSF, a 2 ml. volume of Isovue-250 was injected into the epidural space and the flow of contrast was observed. Radiographs were obtained for documentation purposes.    The injectate was administered into the level noted above.   Additional Comments:  The patient tolerated the procedure well Dressing: 2 x 2 sterile gauze and Band-Aid    Post-procedure details: Patient was observed during the procedure. Post-procedure instructions were reviewed.  Patient left the clinic in stable condition.

## 2019-03-07 NOTE — Progress Notes (Signed)
Justin ChattersRandy V Hambly - 63 y.o. male MRN [id number]  Date of birth: 02-10-1956  Office Visit Note: Visit Date: 01/25/2019 PCP: [name], [name], MD Referred by: [name], [name], MD  Subjective: Chief Complaint  Patient presents with   Lower Back - Pain   Left Thigh - Pain   HPI:  Justin ChattersRandy V Gulla is a 63 y.o. male who comes in today For planned lumbar epidural injection.  Mr. Georgie ChardWeavil is well-known to me as I have seen him over the years for a combination of intermittent epidural injection and lumbar facet joint ablation.  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  He will have occasion to have sort of lumbar sprain strain type of back injury and pain.  He has degenerative changes of the lumbar spine with facet arthropathy without high-grade central stenosis or high-grade disc extrusion.  Last time I saw him was in January and he had more of a sprain strain type of problem will be completed local trigger point injection with good relief of symptoms.  Since that time he actually saw Dr. Magnus IvanBlackman in our office for evaluation of his hip.  He was having hip and groin pain on the left.  They wanted to get an MRI of the hip but it appears at some point he was being evaluated concurrently through his physicians in [facility] which [facility].  He did see a Dr. Thamas JaegersLennon who appears to be a sports medicine physician.  It appears that he did have an intra-articular injection of the hip using ultrasound.  This also appears to have relieved his groin symptoms but not his posterior buttock and hip pain.  He tells me they had suggested epidural injection but he wanted to have that done here.  He reports 3 months of left posterior hip pain radiating of the posterior thigh.  Symptoms are worse with standing for any length of time and ibuprofen tends to help to some degree.  Hip injection did not help the posterior pain.  We will complete diagnostic epidural  injection today.  If this does not seem to help I would suggest he follow-up with the sports medicine physician to look at more of an issue around the hip joint itself.  ROS Otherwise per HPI.  Assessment & Plan: Visit Diagnoses:  1. Lumbar radiculopathy     Plan: No additional findings.   Meds & Orders:  Meds ordered this encounter  Medications   betamethasone acetate-betamethasone sodium phosphate (CELESTONE) injection 12 mg    Orders Placed This Encounter  Procedures   XR C-ARM NO REPORT   Epidural Steroid injection    Follow-up: No follow-ups on file.   Procedures: No procedures performed  Lumbar Epidural Steroid Injection - Interlaminar Approach with Fluoroscopic Guidance  Patient: Justin Saunders      Date of Birth: 02-10-1956 MRN: [id number] PCP: [name], [name], MD      Visit Date: 01/25/2019   Universal Protocol:     Consent Given By: the patient  Position: PRONE  Additional Comments: Vital signs were monitored before and after the procedure. Patient was prepped and draped in the usual sterile fashion. The correct patient, procedure, and site was verified.   Injection Procedure Details:  Procedure Site One Meds Administered:  Meds ordered this encounter  Medications   betamethasone acetate-betamethasone sodium phosphate (CELESTONE) injection 12 mg     Laterality: Left  Location/Site:  L5-S1  Needle size: 20  G  Needle type: Tuohy  Needle Placement: Paramedian epidural  Findings:   -Comments: Excellent flow of contrast into the epidural space.  Procedure Details: Using a paramedian approach from the side mentioned above, the region overlying the inferior lamina was localized under fluoroscopic visualization and the soft tissues overlying this structure were infiltrated with 4 ml. of 1% Lidocaine without Epinephrine. The Tuohy needle was inserted into the epidural space using a paramedian approach.   The epidural space was localized using  loss of resistance along with lateral and bi-planar fluoroscopic views.  After negative aspirate for air, blood, and CSF, a 2 ml. volume of Isovue-250 was injected into the epidural space and the flow of contrast was observed. Radiographs were obtained for documentation purposes.    The injectate was administered into the level noted above.   Additional Comments:  The patient tolerated the procedure well Dressing: 2 x 2 sterile gauze and Band-Aid    Post-procedure details: Patient was observed during the procedure. Post-procedure instructions were reviewed.  Patient left the clinic in stable condition.   Clinical History: Lumbar spine MRI dated 07/22/2016  1. No notable change compared to 2014. 2. L2-3 advanced degenerative disc narrowing with borderline moderate left foraminal stenosis. 3. L3-4 to L5-S1 disc bulging with noncompressive bilateral foraminal narrowing.  Cervical spine MRI10/15/2016  IMPRESSION: Severe right foraminal narrowing at C5-6 due to uncovertebral spurring. There is mild deformity of the ventral right hemicord at this level due to a disc osteophyte complex to the right.  Severe right and moderate to moderately severe left foraminal narrowing C6-7 due to uncovertebral disease.     Objective:  VS:  HT:     WT:    BMI:      BP:111/75   HR:(!) 58bpm   TEMP: ( )   RESP:  Physical Exam Vitals signs and nursing note reviewed.  Constitutional:      General: He is not in acute distress.    Appearance: Normal appearance. He is well-developed.  HENT:     Head: Normocephalic and atraumatic.  Eyes:     Conjunctiva/sclera: Conjunctivae normal.     Pupils: Pupils are equal, round, and reactive to light.  Neck:     Musculoskeletal: Normal range of motion and neck supple. No neck rigidity.  Cardiovascular:     Rate and Rhythm: Normal rate.     Pulses: Normal pulses.     Heart sounds: Normal heart sounds.  Pulmonary:     Effort: Pulmonary effort is normal.  No respiratory distress.  Musculoskeletal:     Right lower leg: No edema.     Left lower leg: No edema.     Comments: Patient has low back pain worse with extension of the lumbar spine.  No real pain with hip rotation although he does get stiffness and some lack of motion at end ranges left more than right.  Equivocal slump test on the left.  Good distal strength.  Skin:    General: Skin is warm and dry.     Findings: No erythema or rash.  Neurological:     General: No focal deficit present.     Mental Status: He is alert and oriented to person, place, and time.     Sensory: No sensory deficit.     Coordination: Coordination normal.     Gait: Gait normal.  Psychiatric:        Mood and Affect: Mood normal.        Behavior: Behavior normal.  Ortho Exam Imaging: No results found.

## 2019-04-29 DIAGNOSIS — F32A Depression, unspecified: Secondary | ICD-10-CM | POA: Insufficient documentation

## 2019-05-26 DIAGNOSIS — R5381 Other malaise: Secondary | ICD-10-CM | POA: Insufficient documentation

## 2019-05-26 DIAGNOSIS — R5383 Other fatigue: Secondary | ICD-10-CM | POA: Insufficient documentation

## 2019-08-23 DIAGNOSIS — M1812 Unilateral primary osteoarthritis of first carpometacarpal joint, left hand: Secondary | ICD-10-CM | POA: Insufficient documentation

## 2019-10-28 ENCOUNTER — Telehealth: Payer: Self-pay | Admitting: Physical Medicine and Rehabilitation

## 2019-10-28 NOTE — Telephone Encounter (Signed)
Patient is requesting another injection. He reports pain in the same area as prior to his left L5-S1 IL on 01/25/2019. Ok to repeat?

## 2019-10-28 NOTE — Telephone Encounter (Signed)
Spoke with Zella Ball B and she states no pa is needed for [id number] Reference# [id number]

## 2019-10-28 NOTE — Telephone Encounter (Signed)
ok 

## 2019-10-28 NOTE — Telephone Encounter (Signed)
Is auth needed for [id number]? Scheduled for 4/27 at 1545 with driver and no blood thinners.

## 2019-11-15 ENCOUNTER — Ambulatory Visit: Payer: Self-pay

## 2019-11-15 ENCOUNTER — Ambulatory Visit: Payer: BC Managed Care – PPO | Admitting: Physical Medicine and Rehabilitation

## 2019-11-15 ENCOUNTER — Encounter: Payer: Self-pay | Admitting: Physical Medicine and Rehabilitation

## 2019-11-15 ENCOUNTER — Other Ambulatory Visit: Payer: Self-pay

## 2019-11-15 VITALS — BP 136/78 | HR 70

## 2019-11-15 DIAGNOSIS — M47816 Spondylosis without myelopathy or radiculopathy, lumbar region: Secondary | ICD-10-CM

## 2019-11-15 DIAGNOSIS — M5116 Intervertebral disc disorders with radiculopathy, lumbar region: Secondary | ICD-10-CM

## 2019-11-15 DIAGNOSIS — M5136 Other intervertebral disc degeneration, lumbar region: Secondary | ICD-10-CM | POA: Diagnosis not present

## 2019-11-15 DIAGNOSIS — M5416 Radiculopathy, lumbar region: Secondary | ICD-10-CM

## 2019-11-15 MED ORDER — METHYLPREDNISOLONE ACETATE 80 MG/ML IJ SUSP
40.0000 mg | Freq: Once | INTRAMUSCULAR | Status: AC
  Administered 2019-11-15: 40 mg

## 2019-11-15 NOTE — Progress Notes (Signed)
 .  Numeric Pain Rating Scale and Functional Assessment Average Pain 8   In the last MONTH (on 0-10 scale) has pain interfered with the following?  1. General activity like being  able to carry out your everyday physical activities such as walking, climbing stairs, carrying groceries, or moving a chair?  Rating(8)   +Driver, -BT, -Dye Allergies.  

## 2019-11-16 ENCOUNTER — Encounter: Payer: Self-pay | Admitting: Physical Medicine and Rehabilitation

## 2019-11-16 NOTE — Progress Notes (Signed)
Justin Saunders - 64 y.o. male MRN [id number]  Date of birth: [birth year]  Office Visit Note: Visit Date: 11/15/2019 PCP: Karle Plumber, MD Referred by: Karle Plumber, MD  Subjective: Chief Complaint  Patient presents with  . Lower Back - Pain  . Right Thigh - Pain  . Left Thigh - Pain   HPI: Justin Saunders is a 64 y.o. male who comes in today For eval ration management of worsening left more than right but bilateral low back pain with some referral into the buttock and thighs.  Nothing past the knee.  No paresthesia.  No focal weakness or new trauma.  The patient rates his pain as an 8 out of 10.  He reports this is been ongoing now for several months and he always reports to me that he just waits until the last minute to have the injections if he needs to have them.  The last time I saw him we did complete epidural injection in July of last year and that did seem to help him at the time.  He was having more hip and buttock pain at the time as well.  He does use ibuprofen and meloxicam as well as methocarbamol.  He has been through physical therapy and chiropractic care.  He has a chronic pain syndrome with neck and upper back pain and lower back pain.  In the past we have completed radiofrequency ablation of the L3-4 and L4-5 facet joints.  The last ablation completed was about a year and a half ago.  He got good relief from that from his axial pain.  It is hard for him to distinguish at times some differences in the pain that he gets between the axial pain versus some of this referral pain in the buttocks.  MRI last updated in 2018 did not show any stenosis.  He had degenerative changes at L2-3.  He has some lateral recess changes but no real high-grade stenosis or focal nerve compression.  His case is complicated by anxiety.  He does use Xanax.  Review of Systems  Musculoskeletal: Positive for back pain.  All other systems reviewed and are negative.  Otherwise per HPI.  Assessment &  Plan: Visit Diagnoses:  1. Lumbar radiculopathy   2. Radiculopathy due to lumbar intervertebral disc disorder   3. Spondylosis without myelopathy or radiculopathy, lumbar region   4. Other intervertebral disc degeneration, lumbar region     Plan: Findings:  Chronic history of mostly axial low back pain occasional referral in the buttocks and thighs.  Some consideration given to the fact that we know he has facet mediated low back pain has been relieved with radiofrequency ablation and those notes can be reviewed.  He also gets a little bit of referral into the buttocks with standing and ambulating.  No high-grade stenosis.  Epidural injection in the past has helped.  He basically has 2 issues going on without frank radicular symptoms at all.  He does have some myofascial pain.  At this point I think epidural injection since it helped last time is worth repeating.  If it does well obviously we can do that intermittently.  He will continue with home exercise and current anti-inflammatory medication.  He is looking to retire possibly in the next year or so which probably will help his back.  Depending on relief of the epidural would look at repeat radiofrequency ablation with fluoroscopic guidance.  This would be at L2-3 and L3-4.  Lastly  would think about updating MRI of the lumbar spine should his pain continue to be unrelenting.    Meds & Orders:  Meds ordered this encounter  Medications  . methylPREDNISolone acetate (DEPO-MEDROL) injection 40 mg    Orders Placed This Encounter  Procedures  . XR C-ARM NO REPORT  . Epidural Steroid injection    Follow-up: Return for Consider repeat facet joint ablation..   Procedures: No procedures performed  Lumbar Epidural Steroid Injection - Interlaminar Approach with Fluoroscopic Guidance  Patient: TOBBY Saunders      Date of Birth: 07/14/1956 MRN: [id number] PCP: Karle Plumber, MD      Visit Date: 11/15/2019   Universal Protocol:     Consent  Given By: the patient  Position: PRONE  Additional Comments: Vital signs were monitored before and after the procedure. Patient was prepped and draped in the usual sterile fashion. The correct patient, procedure, and site was verified.   Injection Procedure Details:  Procedure Site One Meds Administered:  Meds ordered this encounter  Medications  . methylPREDNISolone acetate (DEPO-MEDROL) injection 40 mg     Laterality: Left  Location/Site:  L5-S1  Needle size: 20 G  Needle type: Tuohy  Needle Placement: Paramedian epidural  Findings:   -Comments: Excellent flow of contrast into the epidural space.  Procedure Details: Using a paramedian approach from the side mentioned above, the region overlying the inferior lamina was localized under fluoroscopic visualization and the soft tissues overlying this structure were infiltrated with 4 ml. of 1% Lidocaine without Epinephrine. The Tuohy needle was inserted into the epidural space using a paramedian approach.   The epidural space was localized using loss of resistance along with lateral and bi-planar fluoroscopic views.  After negative aspirate for air, blood, and CSF, a 2 ml. volume of Isovue-250 was injected into the epidural space and the flow of contrast was observed. Radiographs were obtained for documentation purposes.    The injectate was administered into the level noted above.   Additional Comments:  The patient tolerated the procedure well Dressing: 2 x 2 sterile gauze and Band-Aid    Post-procedure details: Patient was observed during the procedure. Post-procedure instructions were reviewed.  Patient left the clinic in stable condition.    Clinical History: Lumbar spine MRI dated 07/22/2016  1. No notable change compared to 2014. 2. L2-3 advanced degenerative disc narrowing with borderline moderate left foraminal stenosis. 3. L3-4 to L5-S1 disc bulging with noncompressive bilateral foraminal  narrowing.  Cervical spine MRI10/15/2016  IMPRESSION: Severe right foraminal narrowing at C5-6 due to uncovertebral spurring. There is mild deformity of the ventral right hemicord at this level due to a disc osteophyte complex to the right.  Severe right and moderate to moderately severe left foraminal narrowing C6-7 due to uncovertebral disease.   He reports that he has been smoking cigarettes. He has a 42.00 pack-year smoking history. He has never used smokeless tobacco. No results for input(s): HGBA1C, LABURIC in the last 8760 hours.  Objective:  VS:  HT:    WT:   BMI:     BP:136/78  HR:70bpm  TEMP: ( )  RESP:  Physical Exam Vitals and nursing note reviewed.  Constitutional:      General: He is not in acute distress.    Appearance: Normal appearance. He is well-developed.  HENT:     Head: Normocephalic and atraumatic.  Eyes:     Conjunctiva/sclera: Conjunctivae normal.     Pupils: Pupils are equal, round, and reactive to  light.  Cardiovascular:     Rate and Rhythm: Normal rate.     Pulses: Normal pulses.     Heart sounds: Normal heart sounds.  Pulmonary:     Effort: Pulmonary effort is normal. No respiratory distress.  Musculoskeletal:     Cervical back: Normal range of motion and neck supple. No rigidity.     Right lower leg: No edema.     Left lower leg: No edema.     Comments: Stiff throughout the lower spine with taut bands and myofascial tightness without focal trigger point noted none of that reproduces his current pain.  He does have pain with facet joint loading and extension.  This does reproduce some of his pain.  He has no pain over the trochanters bilaterally.  He has no pain with hip rotation.  He does get some pain into the left anterior thigh but this is not reproduced with hip movement.  He has good distal strength without clonus.  Skin:    General: Skin is warm and dry.     Findings: No erythema or rash.  Neurological:     General: No focal deficit  present.     Mental Status: He is alert and oriented to person, place, and time.     Sensory: No sensory deficit.     Coordination: Coordination normal.     Gait: Gait normal.  Psychiatric:        Mood and Affect: Mood normal.        Behavior: Behavior normal.     Ortho Exam  Imaging: XR C-ARM NO REPORT  Result Date: 11/15/2019 Please see Notes tab for imaging impression.   Past Medical/Family/Surgical/Social History: Medications & Allergies reviewed per EMR, new medications updated. Patient Active Problem List   Diagnosis Date Noted  . Perennial allergic rhinitis 01/16/2016  . Chronic sinusitis 01/16/2016  . Tobacco use 01/16/2016   Past Medical History:  Diagnosis Date  . Allergic rhinitis   . Back pain   . DDD (degenerative disc disease), cervical    thoracic and lumbar  . Heart disease   . Hyperlipidemia   . Hypertension   . Hypothyroidism   . Panic attacks   . Sinusitis    Family History  Problem Relation Age of Onset  . Allergic rhinitis Neg Hx   . Angioedema Neg Hx   . Asthma Neg Hx   . Eczema Neg Hx   . Immunodeficiency Neg Hx   . Urticaria Neg Hx    Past Surgical History:  Procedure Laterality Date  . APPENDECTOMY  1974  . CHOLECYSTECTOMY  2012  . epideral steroid injections     in cervical,thoracic and lumbar  . stents  6294,7654   3 heart stents 1st surgery,then 1 stent 2cd surgery  . TONSILLECTOMY     Social History   Occupational History  . Not on file  Tobacco Use  . Smoking status: Current Every Day Smoker    Packs/day: 1.00    Years: 42.00    Pack years: 42.00    Types: Cigarettes  . Smokeless tobacco: Never Used  Substance and Sexual Activity  . Alcohol use: No  . Drug use: No  . Sexual activity: Not on file

## 2019-11-16 NOTE — Procedures (Signed)
Lumbar Epidural Steroid Injection - Interlaminar Approach with Fluoroscopic Guidance  Patient: Justin Saunders      Date of Birth: May 21, 1956 MRN: [id number] PCP: Karle Plumber, MD      Visit Date: 11/15/2019   Universal Protocol:     Consent Given By: the patient  Position: PRONE  Additional Comments: Vital signs were monitored before and after the procedure. Patient was prepped and draped in the usual sterile fashion. The correct patient, procedure, and site was verified.   Injection Procedure Details:  Procedure Site One Meds Administered:  Meds ordered this encounter  Medications  . methylPREDNISolone acetate (DEPO-MEDROL) injection 40 mg     Laterality: Left  Location/Site:  L5-S1  Needle size: 20 G  Needle type: Tuohy  Needle Placement: Paramedian epidural  Findings:   -Comments: Excellent flow of contrast into the epidural space.  Procedure Details: Using a paramedian approach from the side mentioned above, the region overlying the inferior lamina was localized under fluoroscopic visualization and the soft tissues overlying this structure were infiltrated with 4 ml. of 1% Lidocaine without Epinephrine. The Tuohy needle was inserted into the epidural space using a paramedian approach.   The epidural space was localized using loss of resistance along with lateral and bi-planar fluoroscopic views.  After negative aspirate for air, blood, and CSF, a 2 ml. volume of Isovue-250 was injected into the epidural space and the flow of contrast was observed. Radiographs were obtained for documentation purposes.    The injectate was administered into the level noted above.   Additional Comments:  The patient tolerated the procedure well Dressing: 2 x 2 sterile gauze and Band-Aid    Post-procedure details: Patient was observed during the procedure. Post-procedure instructions were reviewed.  Patient left the clinic in stable condition.

## 2019-12-27 ENCOUNTER — Telehealth: Payer: Self-pay | Admitting: Physical Medicine and Rehabilitation

## 2019-12-27 NOTE — Telephone Encounter (Signed)
Patient called requesting to make an appointment with Dr. Alvester Morin.  CB#[phone].  Thank you.

## 2019-12-27 NOTE — Telephone Encounter (Signed)
Likley need repeat RFA

## 2019-12-27 NOTE — Telephone Encounter (Signed)
Pt had Lt L5-S1 IL 11/15/19, pt would like to have another injection, ok to repeat if no new injuries/traumas

## 2019-12-29 NOTE — Telephone Encounter (Signed)
Pt is scheduled for 02/20/20 and 02/27/20 with driver for RFA.

## 2019-12-29 NOTE — Telephone Encounter (Signed)
Spoke with [employer] from pt insurance and she states no pa is needed for [id number] and [phone] Auth# [id number]

## 2020-01-12 ENCOUNTER — Telehealth: Payer: Self-pay | Admitting: Physical Medicine and Rehabilitation

## 2020-01-12 NOTE — Telephone Encounter (Signed)
Patient called. His insurance will change February 19 2020. He would like to know if he can get an appointment before then or if his new insurance will be verified before his appointment. His call back number is [phone]

## 2020-01-13 ENCOUNTER — Other Ambulatory Visit: Payer: Self-pay | Admitting: Orthopaedic Surgery

## 2020-01-13 DIAGNOSIS — M25552 Pain in left hip: Secondary | ICD-10-CM

## 2020-01-18 NOTE — Telephone Encounter (Signed)
Called patient to advise  °

## 2020-01-27 ENCOUNTER — Telehealth: Payer: Self-pay | Admitting: Physical Medicine and Rehabilitation

## 2020-01-27 NOTE — Telephone Encounter (Signed)
Pt would like a CB to discuss his insurance change and a new game plan.   [phone]

## 2020-02-03 NOTE — Telephone Encounter (Signed)
Patient is going to see if he can get his member ID number for his new insurance. He will call us if he does get this information so we can get his RFA authorized with his new insurance. If he does not get this information by 7/22, he will call us and we might have to reschedule his RFA. I am holding a 1000 appointment on 8/12 pending.

## 2020-02-09 ENCOUNTER — Telehealth: Payer: Self-pay | Admitting: Physical Medicine and Rehabilitation

## 2020-02-09 NOTE — Telephone Encounter (Signed)
Called patient to advise that we did receive fax and Berkley Harvey is not required with his new insurance.

## 2020-02-09 NOTE — Telephone Encounter (Signed)
Patient called advised he is faxing over his insurance cards to [place] and would like a call back when the information is received. The number to contact patient is [phone]

## 2020-02-09 NOTE — Telephone Encounter (Signed)
Auth [id number] not required for the pt insurance

## 2020-02-20 ENCOUNTER — Ambulatory Visit: Payer: BC Managed Care – PPO

## 2020-02-20 ENCOUNTER — Encounter: Payer: Self-pay | Admitting: Physical Medicine and Rehabilitation

## 2020-02-20 ENCOUNTER — Ambulatory Visit (INDEPENDENT_AMBULATORY_CARE_PROVIDER_SITE_OTHER): Payer: No Typology Code available for payment source | Admitting: Physical Medicine and Rehabilitation

## 2020-02-20 ENCOUNTER — Other Ambulatory Visit: Payer: Self-pay

## 2020-02-20 VITALS — BP 130/76 | HR 60

## 2020-02-20 DIAGNOSIS — M47816 Spondylosis without myelopathy or radiculopathy, lumbar region: Secondary | ICD-10-CM | POA: Diagnosis not present

## 2020-02-20 NOTE — Progress Notes (Signed)
Pt states left lower back pain. Pt states working ,sleeping makes the pain worse. Pt states Meds helps with some of the pain. Pt has hx of inj on 11/15/19 that it didn't work it lasted a two weeks. Pt states it was the end of May when the pain came back.  .Numeric Pain Rating Scale and Functional Assessment Average Pain 8   In the last MONTH (on 0-10 scale) has pain interfered with the following?  1. General activity like being  able to carry out your everyday physical activities such as walking, climbing stairs, carrying groceries, or moving a chair?  Rating(8)   +Driver, -BT, -Dye Allergies.

## 2020-02-21 NOTE — Progress Notes (Signed)
Justin Saunders - 64 y.o. male MRN [id number]  Date of birth: 10-13-1955  Office Visit Note: Visit Date: 02/20/2020 PCP: Karle Plumber, MD Referred by: Karle Plumber, MD  Subjective: Chief Complaint  Patient presents with  . Lower Back - Pain   HPI:  Justin Saunders is a 64 y.o. male who comes in today for planned repeat radiofrequency ablation of the Left L2-L3 L3-L4 Lumbar facet joints. This would be ablation of the corresponding medial branches and/or dorsal rami.  Patient has had double diagnostic blocks with more than 70% relief.  Subsequent ablation gave them more than 6 months of over 60% relief.  They have had chronic back pain for quite some time, more than 3 months, which has been an ongoing situation with recalcitrant axial back pain.  They have no radicular pain.  Their axial pain is worse with standing and ambulating and on exam today with facet loading.  They have had physical therapy as well as home exercise program.  The imaging noted in the chart below indicated facet pathology. Accordingly they meet all the criteria and qualification for for radiofrequency ablation and we are going to complete this today hopefully for more longer term relief as part of comprehensive management program.   When I last saw the patient he again was having more left than right low back pain but bilateral pain.  Left low back pain over the iliac crest.  We did complete epidural injection just to see if there was any issue with the disc nerve junction that he has at the L2-3 and 3-4 region.  Feeling like this may have been a referral pain it did not seem to help much.  He has had good results with ablation in the past and has been over a year and a half since we have done the ablation.  Part of the delay was coronavirus issues.  We will repeat the ablation today depending on relief would look at further evaluation of his hip potentially as a pain generator.  He does have mild groin pain but is  pretty free motion today with movement.  Prior hip x-rays in 2019 showed right more than left only really moderate changes.  Patient was actually seen in April 2020 for his left hip and an MRI of the left hip was ordered but never performed.  It appears to me that reviewing the notes today after the patient left he had another order placed for MRI in June and this has not been done either.  He is also had initial consultation with a [facility] general surgeon for inguinal pain.  That note by Dr. Harlin Heys was reviewed. Assessment: 1. Left groin pain. Suspect musculoskeletal etiology, including lumbar radiculopathy. Palpable small infra inguinal lymph node and generalized fascial laxity bilaterally, but without evidence of incarceration/strangulation and doubtful the cause of his chronic groin discomfort that seems to worsen throughout the day and with excessive ambulation. His pain also improved following a left hip injection. Negative recent CT scan for hernia 2. Chronic pain syndrome (lower back and neck) 3. Anxiety   ROS Otherwise per HPI.  Assessment & Plan: Visit Diagnoses:  1. Spondylosis without myelopathy or radiculopathy, lumbar region     Plan: No additional findings.   Meds & Orders: No orders of the defined types were placed in this encounter.   Orders Placed This Encounter  Procedures  . Radiofrequency,Lumbar  . XR C-ARM NO REPORT    Follow-up: Return in  about 2 weeks (around 03/05/2020).   Procedures: No procedures performed  Lumbar Facet Joint Nerve Denervation  Patient: Justin Saunders      Date of Birth: 1956-07-20 MRN: [id number] PCP: Karle Plumber, MD      Visit Date: 02/20/2020   Universal Protocol:    Date/Time: 08/03/216:38 AM  Consent Given By: the patient  Position: PRONE  Additional Comments: Vital signs were monitored before and after the procedure. Patient was prepped and draped in the usual sterile fashion. The correct patient, procedure, and  site was verified.   Injection Procedure Details:  Procedure Site One Meds Administered: No orders of the defined types were placed in this encounter.    Laterality: Left  Location/Site:  L2-L3 L3-L4  Needle size: 18 G  Needle type: Radiofrequency cannula  Needle Placement: Along juncture of superior articular process and transverse pocess  Findings:  -Comments:  Procedure Details: For each desired target nerve, the corresponding transverse process (sacral ala for the L5 dorsal rami) was identified and the fluoroscope was positioned to square off the endplates of the corresponding vertebral body to achieve a true AP midline view.  The beam was then obliqued 15 to 20 degrees and caudally tilted 15 to 20 degrees to line up a trajectory along the target nerves. The skin over the target of the junction of superior articulating process and transverse process (sacral ala for the L5 dorsal rami) was infiltrated with 70ml of 1% Lidocaine without Epinephrine.  The 18 gauge 57mm active tip outer cannula was advanced in trajectory view to the target.  This procedure was repeated for each target nerve.  Then, for all levels, the outer cannula placement was fine-tuned and the position was then confirmed with bi-planar imaging.    Test stimulation was done both at sensory and motor levels to ensure there was no radicular stimulation. The target tissues were then infiltrated with 1 ml of 1% Lidocaine without Epinephrine. Subsequently, a percutaneous neurotomy was carried out for 90 seconds at 80 degrees Celsius.  After the completion of the lesion, 1 ml of injectate was delivered. It was then repeated for each facet joint nerve mentioned above. Appropriate radiographs were obtained to verify the probe placement during the neurotomy.   Additional Comments:  The patient tolerated the procedure well Dressing: 2 x 2 sterile gauze and Band-Aid    Post-procedure details: Patient was observed during  the procedure. Post-procedure instructions were reviewed.  Patient left the clinic in stable condition.        Clinical History: Lumbar spine MRI dated 07/22/2016  1. No notable change compared to 2014. 2. L2-3 advanced degenerative disc narrowing with borderline moderate left foraminal stenosis. 3. L3-4 to L5-S1 disc bulging with noncompressive bilateral foraminal narrowing.  Cervical spine MRI10/15/2016  IMPRESSION: Severe right foraminal narrowing at C5-6 due to uncovertebral spurring. There is mild deformity of the ventral right hemicord at this level due to a disc osteophyte complex to the right.  Severe right and moderate to moderately severe left foraminal narrowing C6-7 due to uncovertebral disease.     Objective:  VS:  HT:    WT:   BMI:     BP:130/76  HR:60bpm  TEMP: ( )  RESP:  Physical Exam Constitutional:      General: He is not in acute distress.    Appearance: Normal appearance. He is not ill-appearing.  HENT:     Head: Normocephalic and atraumatic.     Right Ear: External ear normal.  Left Ear: External ear normal.  Eyes:     Extraocular Movements: Extraocular movements intact.  Cardiovascular:     Rate and Rhythm: Normal rate.     Pulses: Normal pulses.  Abdominal:     General: There is no distension.     Palpations: Abdomen is soft.  Musculoskeletal:        General: No tenderness or signs of injury.     Right lower leg: No edema.     Left lower leg: No edema.     Comments: Patient has good distal strength without clonus. Patient somewhat slow to rise from a seated position to full extension.  There is concordant low back pain with facet loading and lumbar spine extension rotation.  There are no definitive trigger points but the patient is somewhat tender across the lower back and PSIS.  There is no pain with hip rotation.  Skin:    Findings: No erythema or rash.  Neurological:     General: No focal deficit present.     Mental Status:  He is alert and oriented to person, place, and time.     Sensory: No sensory deficit.     Motor: No weakness or abnormal muscle tone.     Coordination: Coordination normal.  Psychiatric:        Mood and Affect: Mood normal.        Behavior: Behavior normal.      Imaging: XR C-ARM NO REPORT  Result Date: 02/20/2020 Please see Notes tab for imaging impression.

## 2020-02-21 NOTE — Procedures (Signed)
Lumbar Facet Joint Nerve Denervation  Patient: Justin Saunders      Date of Birth: Oct 03, 1955 MRN: [id number] PCP: Karle Plumber, MD      Visit Date: 02/20/2020   Universal Protocol:    Date/Time: 08/03/216:38 AM  Consent Given By: the patient  Position: PRONE  Additional Comments: Vital signs were monitored before and after the procedure. Patient was prepped and draped in the usual sterile fashion. The correct patient, procedure, and site was verified.   Injection Procedure Details:  Procedure Site One Meds Administered: No orders of the defined types were placed in this encounter.    Laterality: Left  Location/Site:  L2-L3 L3-L4  Needle size: 18 G  Needle type: Radiofrequency cannula  Needle Placement: Along juncture of superior articular process and transverse pocess  Findings:  -Comments:  Procedure Details: For each desired target nerve, the corresponding transverse process (sacral ala for the L5 dorsal rami) was identified and the fluoroscope was positioned to square off the endplates of the corresponding vertebral body to achieve a true AP midline view.  The beam was then obliqued 15 to 20 degrees and caudally tilted 15 to 20 degrees to line up a trajectory along the target nerves. The skin over the target of the junction of superior articulating process and transverse process (sacral ala for the L5 dorsal rami) was infiltrated with 76ml of 1% Lidocaine without Epinephrine.  The 18 gauge 68mm active tip outer cannula was advanced in trajectory view to the target.  This procedure was repeated for each target nerve.  Then, for all levels, the outer cannula placement was fine-tuned and the position was then confirmed with bi-planar imaging.    Test stimulation was done both at sensory and motor levels to ensure there was no radicular stimulation. The target tissues were then infiltrated with 1 ml of 1% Lidocaine without Epinephrine. Subsequently, a percutaneous  neurotomy was carried out for 90 seconds at 80 degrees Celsius.  After the completion of the lesion, 1 ml of injectate was delivered. It was then repeated for each facet joint nerve mentioned above. Appropriate radiographs were obtained to verify the probe placement during the neurotomy.   Additional Comments:  The patient tolerated the procedure well Dressing: 2 x 2 sterile gauze and Band-Aid    Post-procedure details: Patient was observed during the procedure. Post-procedure instructions were reviewed.  Patient left the clinic in stable condition.

## 2020-02-27 ENCOUNTER — Ambulatory Visit (INDEPENDENT_AMBULATORY_CARE_PROVIDER_SITE_OTHER): Payer: BC Managed Care – PPO | Admitting: Physical Medicine and Rehabilitation

## 2020-02-27 ENCOUNTER — Ambulatory Visit: Payer: Self-pay

## 2020-02-27 ENCOUNTER — Other Ambulatory Visit: Payer: Self-pay

## 2020-02-27 ENCOUNTER — Encounter: Payer: Self-pay | Admitting: Physical Medicine and Rehabilitation

## 2020-02-27 VITALS — BP 130/78 | HR 64

## 2020-02-27 DIAGNOSIS — M25552 Pain in left hip: Secondary | ICD-10-CM | POA: Diagnosis not present

## 2020-02-27 DIAGNOSIS — M5116 Intervertebral disc disorders with radiculopathy, lumbar region: Secondary | ICD-10-CM

## 2020-02-27 DIAGNOSIS — M5416 Radiculopathy, lumbar region: Secondary | ICD-10-CM

## 2020-02-27 DIAGNOSIS — M47816 Spondylosis without myelopathy or radiculopathy, lumbar region: Secondary | ICD-10-CM | POA: Diagnosis not present

## 2020-02-27 NOTE — Progress Notes (Signed)
Pt states left lower back. Pt has hx of inj on 02/20/20 pt state the shot was fine he felt relief of the left side.  Numeric Pain Rating Scale and Functional Assessment Average Pain 5   In the last MONTH (on 0-10 scale) has pain interfered with the following?  1. General activity like being  able to carry out your everyday physical activities such as walking, climbing stairs, carrying groceries, or moving a chair?  Rating(5)   +Driver, -BT, -Dye Allergies.

## 2020-04-02 ENCOUNTER — Encounter: Payer: Self-pay | Admitting: Physical Medicine and Rehabilitation

## 2020-04-02 MED ORDER — TRIAMCINOLONE ACETONIDE 40 MG/ML IJ SUSP
60.0000 mg | INTRAMUSCULAR | Status: AC | PRN
  Administered 2020-02-27: 60 mg via INTRA_ARTICULAR

## 2020-04-02 MED ORDER — BUPIVACAINE HCL 0.25 % IJ SOLN
4.0000 mL | INTRAMUSCULAR | Status: AC | PRN
  Administered 2020-02-27: 4 mL via INTRA_ARTICULAR

## 2020-04-02 NOTE — Progress Notes (Signed)
Justin Saunders - 64 y.o. male MRN [id number]  Date of birth: [birth year]  Office Visit Note: Visit Date: 02/27/2020 PCP: Karle Plumber, MD Referred by: Karle Plumber, MD  Subjective: Chief Complaint  Patient presents with  . Left Hip - Pain  . Lower Back - Pain   HPI: Justin Saunders is a 64 y.o. male who comes in today For follow-up evaluation management of left low back and left posterior lateral hip pain with some groin pain.  He is 2 weeks status post radiofrequency ablation of the upper lumbar facet joints.  This is a chronic situation for him with arthritic and degenerative changes in the upper lumbar spine.  He is done extremely well without any does feel like it helped his back to some degree once again.  He has had these intermittently now on about a yearly to year and a half basis over the last several years.  He has had epidural injection very intermittently at times for more radicular type pain and it tends to be on the left.  Today however he really is having difficulty ambulating and going from sit to stand.  The pain refers into the posterior hip as well as the left anterior upper thigh and into the groin to some degree and down to the knee.  Seems like a classic hip distribution.  Hip x-rays have been unrevealing in the past with only mild degenerative change.  He denies any symptoms on the right.  No focal weakness no bowel or bladder changes no trauma.  Symptoms are better at rest and with medication.  ROS Otherwise per HPI.  Assessment & Plan: Visit Diagnoses:  1. Pain in left hip   2. Lumbar radiculopathy   3. Radiculopathy due to lumbar intervertebral disc disorder   4. Spondylosis without myelopathy or radiculopathy, lumbar region     Plan: No additional findings.   Meds & Orders: No orders of the defined types were placed in this encounter.   Orders Placed This Encounter  Procedures  . Large Joint Inj  . XR C-ARM NO REPORT    Follow-up: No follow-ups on  file.   Procedures: Large Joint Inj: L hip joint on 02/27/2020 4:00 PM Indications: diagnostic evaluation and pain Details: 22 G 3.5 in needle, fluoroscopy-guided anterior approach  Arthrogram: No  Medications: 4 mL bupivacaine 0.25 %; 60 mg triamcinolone acetonide 40 MG/ML Outcome: tolerated well, no immediate complications  There was excellent flow of contrast producing a partial arthrogram of the hip. The patient did have relief of symptoms during the anesthetic phase of the injection. Procedure, treatment alternatives, risks and benefits explained, specific risks discussed. Consent was given by the patient. Immediately prior to procedure a time out was called to verify the correct patient, procedure, equipment, support staff and site/side marked as required. Patient was prepped and draped in the usual sterile fashion.      No notes on file   Clinical History: Lumbar spine MRI dated 07/22/2016  1. No notable change compared to 2014. 2. L2-3 advanced degenerative disc narrowing with borderline moderate left foraminal stenosis. 3. L3-4 to L5-S1 disc bulging with noncompressive bilateral foraminal narrowing.  Cervical spine MRI10/15/2016  IMPRESSION: Severe right foraminal narrowing at C5-6 due to uncovertebral spurring. There is mild deformity of the ventral right hemicord at this level due to a disc osteophyte complex to the right.  Severe right and moderate to moderately severe left foraminal narrowing C6-7 due to uncovertebral disease.  He reports that he has been smoking cigarettes. He has a 42.00 pack-year smoking history. He has never used smokeless tobacco. No results for input(s): HGBA1C, LABURIC in the last 8760 hours.  Objective:  VS:  HT:    WT:   BMI:     BP:130/78  HR:64bpm  TEMP: ( )  RESP:  Physical Exam Constitutional:      General: He is not in acute distress.    Appearance: Normal appearance. He is not ill-appearing.  HENT:     Head: Normocephalic  and atraumatic.     Right Ear: External ear normal.     Left Ear: External ear normal.  Eyes:     Extraocular Movements: Extraocular movements intact.  Cardiovascular:     Rate and Rhythm: Normal rate.     Pulses: Normal pulses.  Abdominal:     General: There is no distension.     Palpations: Abdomen is soft.  Musculoskeletal:        General: No tenderness or signs of injury.     Right lower leg: No edema.     Left lower leg: No edema.     Comments: Patient has good distal strength without clonus.  Some pain over the lateral greater trochanter.  He is apprehensive with internal rotation on the left with some pain at the end ranges.  Skin:    Findings: No erythema or rash.  Neurological:     General: No focal deficit present.     Mental Status: He is alert and oriented to person, place, and time.     Sensory: No sensory deficit.     Motor: No weakness or abnormal muscle tone.     Coordination: Coordination normal.  Psychiatric:        Mood and Affect: Mood normal.        Behavior: Behavior normal.     Ortho Exam  Imaging: No results found.  Past Medical/Family/Surgical/Social History: Medications & Allergies reviewed per EMR, new medications updated. Patient Active Problem List   Diagnosis Date Noted  . Perennial allergic rhinitis 01/16/2016  . Chronic sinusitis 01/16/2016  . Tobacco use 01/16/2016   Past Medical History:  Diagnosis Date  . Allergic rhinitis   . Back pain   . DDD (degenerative disc disease), cervical    thoracic and lumbar  . Heart disease   . Hyperlipidemia   . Hypertension   . Hypothyroidism   . Panic attacks   . Sinusitis    Family History  Problem Relation Age of Onset  . Allergic rhinitis Neg Hx   . Angioedema Neg Hx   . Asthma Neg Hx   . Eczema Neg Hx   . Immunodeficiency Neg Hx   . Urticaria Neg Hx    Past Surgical History:  Procedure Laterality Date  . APPENDECTOMY  1974  . CHOLECYSTECTOMY  2012  . epideral steroid injections      in cervical,thoracic and lumbar  . stents  8416,6063   3 heart stents 1st surgery,then 1 stent 2cd surgery  . TONSILLECTOMY     Social History   Occupational History  . Not on file  Tobacco Use  . Smoking status: Current Every Day Smoker    Packs/day: 1.00    Years: 42.00    Pack years: 42.00    Types: Cigarettes  . Smokeless tobacco: Never Used  Substance and Sexual Activity  . Alcohol use: No  . Drug use: No  . Sexual activity: Not on file

## 2020-06-05 ENCOUNTER — Telehealth: Payer: Self-pay

## 2020-06-05 DIAGNOSIS — M545 Low back pain, unspecified: Secondary | ICD-10-CM

## 2020-06-05 DIAGNOSIS — G8929 Other chronic pain: Secondary | ICD-10-CM

## 2020-06-05 NOTE — Addendum Note (Signed)
Addended by: Cindie Crumbly B on: 06/05/2020 11:15 AM   Modules accepted: Orders

## 2020-06-05 NOTE — Telephone Encounter (Signed)
Patient reports pain in center of low back which is worse in the mornings. Pain radiates into left thigh, knee, and anterior calf. Please advise.

## 2020-06-05 NOTE — Telephone Encounter (Signed)
Repeat Left hip injection vs MRI left Hip vs MRI Lspine

## 2020-06-05 NOTE — Telephone Encounter (Signed)
Patient called he wants schedule a appointment with Dr.Newton call back:[phone]

## 2020-06-05 NOTE — Telephone Encounter (Signed)
Patient states that he is only having pain in his low back at this time. MRI Lumbar ordered.

## 2020-06-16 ENCOUNTER — Ambulatory Visit (HOSPITAL_BASED_OUTPATIENT_CLINIC_OR_DEPARTMENT_OTHER)
Admission: RE | Admit: 2020-06-16 | Discharge: 2020-06-16 | Disposition: A | Discharge status: Home / Self Care | Payer: Medicare Other | Source: Ambulatory Visit | Attending: Physical Medicine and Rehabilitation | Admitting: Physical Medicine and Rehabilitation

## 2020-06-16 ENCOUNTER — Other Ambulatory Visit (HOSPITAL_BASED_OUTPATIENT_CLINIC_OR_DEPARTMENT_OTHER): Payer: BC Managed Care – PPO

## 2020-06-16 ENCOUNTER — Other Ambulatory Visit: Payer: Self-pay

## 2020-06-16 DIAGNOSIS — G8929 Other chronic pain: Secondary | ICD-10-CM | POA: Diagnosis present

## 2020-06-16 DIAGNOSIS — M545 Low back pain, unspecified: Secondary | ICD-10-CM | POA: Insufficient documentation

## 2020-07-04 ENCOUNTER — Telehealth: Payer: Self-pay | Admitting: Orthopaedic Surgery

## 2020-07-04 ENCOUNTER — Ambulatory Visit (INDEPENDENT_AMBULATORY_CARE_PROVIDER_SITE_OTHER): Payer: No Typology Code available for payment source | Admitting: Physical Medicine and Rehabilitation

## 2020-07-04 ENCOUNTER — Ambulatory Visit: Payer: Self-pay

## 2020-07-04 ENCOUNTER — Other Ambulatory Visit: Payer: Self-pay

## 2020-07-04 ENCOUNTER — Telehealth: Payer: Self-pay | Admitting: Physical Medicine and Rehabilitation

## 2020-07-04 ENCOUNTER — Encounter: Payer: Self-pay | Admitting: Physical Medicine and Rehabilitation

## 2020-07-04 VITALS — BP 137/86 | HR 81

## 2020-07-04 DIAGNOSIS — G8929 Other chronic pain: Secondary | ICD-10-CM

## 2020-07-04 DIAGNOSIS — M1812 Unilateral primary osteoarthritis of first carpometacarpal joint, left hand: Secondary | ICD-10-CM

## 2020-07-04 DIAGNOSIS — M48061 Spinal stenosis, lumbar region without neurogenic claudication: Secondary | ICD-10-CM

## 2020-07-04 DIAGNOSIS — M47816 Spondylosis without myelopathy or radiculopathy, lumbar region: Secondary | ICD-10-CM | POA: Diagnosis not present

## 2020-07-04 DIAGNOSIS — M79645 Pain in left finger(s): Secondary | ICD-10-CM

## 2020-07-04 DIAGNOSIS — M5136 Other intervertebral disc degeneration, lumbar region: Secondary | ICD-10-CM | POA: Diagnosis not present

## 2020-07-04 DIAGNOSIS — M25552 Pain in left hip: Secondary | ICD-10-CM | POA: Diagnosis not present

## 2020-07-04 NOTE — Telephone Encounter (Signed)
Patient submitted medical release form, Justin Saunders disability forms to be signed by Dr. Roda Shutters and $25.00 check payment to signed. Will be mailed to [employer] after Dr. Roda Shutters signs forms. Accepted 07/04/20

## 2020-07-04 NOTE — Telephone Encounter (Signed)
I spoke with Crystal to clarify. Form received today and put in outbox with $25 and release form to send to [facility] for completion

## 2020-07-04 NOTE — Progress Notes (Signed)
Left groin and left buttock pain. Left buttock pain started after helping lift sofa. Low back pain about 60% of the time now. Was having mostly low back pain when he called for appointment.  Numeric Pain Rating Scale and Functional Assessment Average Pain 7 Pain Right Now 6 My pain is constant, dull and aching Pain is worse with: some activites Pain improves with: rest   In the last MONTH (on 0-10 scale) has pain interfered with the following?  1. General activity like being  able to carry out your everyday physical activities such as walking, climbing stairs, carrying groceries, or moving a chair?  Rating(9)  2. Relation with others like being able to carry out your usual social activities and roles such as  activities at home, at work and in your community. Rating(9)  3. Enjoyment of life such that you have  been bothered by emotional problems such as feeling anxious, depressed or irritable?  Rating(10)

## 2020-07-04 NOTE — Telephone Encounter (Signed)
Error. Patient's disability forms be to signed by Dr. Alvester Morin not Dr. Roda Shutters. Please forward to correct doctor. Accepted 07/04/20

## 2020-07-05 ENCOUNTER — Encounter: Payer: Self-pay | Admitting: Physical Medicine and Rehabilitation

## 2020-07-05 MED ORDER — BUPIVACAINE HCL 0.25 % IJ SOLN
4.0000 mL | INTRAMUSCULAR | Status: AC | PRN
  Administered 2020-07-04: 4 mL via INTRA_ARTICULAR

## 2020-07-05 MED ORDER — TRIAMCINOLONE ACETONIDE 40 MG/ML IJ SUSP
60.0000 mg | INTRAMUSCULAR | Status: AC | PRN
  Administered 2020-07-04: 60 mg via INTRA_ARTICULAR

## 2020-07-05 NOTE — Progress Notes (Signed)
Justin Saunders - 64 y.o. male MRN [id number]  Date of birth: July 05, 1956  Office Visit Note: Visit Date: 07/04/2020 PCP: Leola Brazil, DO Referred by: Leola Brazil, DO  Subjective: Chief Complaint  Patient presents with  . Left Hip - Pain  . Lower Back - Pain   HPI:  Justin Saunders is a 64 y.o. male who comes in today For evaluation management of chronic worsening severe left low back and hip pain and hip and groin pain with some referral into the leg.  Patient called in recently with increasing low back pain which was essentially midline to center at the lumbosacral junction which really kept him out of work for a few days it was so painful.  At that time when he called and we decided to repeat MRI of the lumbar spine.  In the interim that pain has gotten better but now is having significant pain on the left hip radiating into the groin and anterior thigh.  He has had this recently and did well with intra-articular hip injection.  X-ray shows some superior lateral narrowing of the joint.  He is having a lot of difficulty going from sit to stand and pain with standing.  No focal weakness no paresthesia.  Occasionally referral past the knee and somewhat of an L5 distribution.  MRI of the lumbar spine was reviewed with him today using spine models and imaging.  He does have some degenerative disc disease at L2-3 which has been chronic in a long time now.  This will likely fuse at some point.  He has some foraminal narrowing at multiple levels.  He has no high-grade central stenosis.  He has multilevel facet arthropathy.  He has benefited in the past for radiofrequency ablation of the lumbar facet joints.  As a secondary note he tells me today about his left CMC joint.  He is evidently been seeing someone in [facility] who has been providing injections to that and that person has been out on medical leave and he has been really suffering from this.  He would like to see somebody in our office  who could look at his [facility] joint.  He would consider surgery for this if needed.  He tells me he is about 1 year away from retiring and he would like to keep going until that point.  Review of Systems  Musculoskeletal: Positive for back pain and joint pain.  All other systems reviewed and are negative.  Otherwise per HPI.  Assessment & Plan: Visit Diagnoses:    ICD-10-CM   1. Pain in left hip  M25.552 XR C-ARM NO REPORT  2. Spondylosis without myelopathy or radiculopathy, lumbar region  M47.816   3. Foraminal stenosis of lumbar region  M48.061   4. Other intervertebral disc degeneration, lumbar region  M51.36   5. Chronic pain of left thumb  M79.645    G89.29   6. Primary osteoarthritis of first carpometacarpal joint of left hand  M18.12     Plan: Findings:  1.  Left hip pain with referral in the groin with difficulty going from sit to stand and with walking.  Walks with a pretty significant limp.  No Trendelenburg sign.  Prior intra-articular injection gave him quite a bit of relief for a couple of months.  Now onset again with significant pain.  We will complete diagnostic hopefully therapeutic intra-articular hip injection today.  The question is whether this hip is causing his back pain or not.  Has had a history of lumbar spondylosis and foraminal narrowing without high-grade central stenosis.  New MRI findings really showed just continued degenerative change with facet arthropathy and foraminal narrowing.  Interestingly does have left-sided L2 area which could cause some referral to the groin.  His pain is worse though with mechanical type movements.  He also has a history of myofascial pain which could have been the reason he had the severe low back pain more recently.  His low back pain has gotten better over time.  We will continue to watch that.  He has seen Dr. Magnus Ivan in the past for his hip I believe.  His brother has actually had hip replacement by Dr. Magnus Ivan.  2.  In terms of  his CMC arthritis and thumb pain I will make referral today and get him appointment with Dr. Glee Arvin for evaluation.    Meds & Orders: No orders of the defined types were placed in this encounter.   Orders Placed This Encounter  Procedures  . Large Joint Inj  . XR C-ARM NO REPORT    Follow-up: Return if symptoms worsen or fail to improve.   Procedures: Large Joint Inj: R hip joint on 07/04/2020 9:45 AM Indications: diagnostic evaluation and pain Details: 22 G 3.5 in needle, fluoroscopy-guided anterior approach  Arthrogram: No  Medications: 4 mL bupivacaine 0.25 %; 60 mg triamcinolone acetonide 40 MG/ML Outcome: tolerated well, no immediate complications  There was excellent flow of contrast producing a partial arthrogram of the hip. The patient did have relief of symptoms during the anesthetic phase of the injection. Procedure, treatment alternatives, risks and benefits explained, specific risks discussed. Consent was given by the patient. Immediately prior to procedure a time out was called to verify the correct patient, procedure, equipment, support staff and site/side marked as required. Patient was prepped and draped in the usual sterile fashion.          Clinical History: MRI LUMBAR SPINE WITHOUT CONTRAST  TECHNIQUE: Multiplanar, multisequence MR imaging of the lumbar spine was performed. No intravenous contrast was administered.  COMPARISON:  07/22/2016  FINDINGS: Segmentation: For consistency with the prior MRI report, the lowest fully formed intervertebral disc space is designated L5-S1. With this numbering, there are small ribs at L1.  Alignment: Slight chronic reversal of the normal lumbar lordosis. No listhesis.  Vertebrae: No fracture or suspicious osseous lesion. Predominantly chronic degenerative endplate changes at L2-3.  Conus medullaris and cauda equina: Conus extends to the L2 level. Conus and cauda equina appear normal.  Paraspinal and  other soft tissues: Unremarkable.  Disc levels:  Disc desiccation throughout the lumbar spine with chronic severe disc space narrowing at L2-3 and mild narrowing at the other levels.  T12-L1 and L1-2: Negative.  L2-3: Circumferential disc osteophyte complex and severe disc space height loss result in mild bilateral lateral recess stenosis and mild right and mild-to-moderate left neural foraminal stenosis without significant generalized spinal stenosis, unchanged.  L3-4: Circumferential disc bulging eccentric to the left results in mild left greater than right lateral recess stenosis and mild right and moderate left neural foraminal stenosis without significant generalized spinal stenosis, unchanged.  L4-5: Circumferential disc bulging and mild facet hypertrophy result in moderate bilateral lateral recess stenosis and mild-to-moderate bilateral neural foraminal stenosis without significant generalized spinal stenosis, unchanged.  L5-S1: Circumferential disc bulging and mild facet hypertrophy result in mild-to-moderate bilateral lateral recess stenosis and mild-to-moderate right and moderate left neural foraminal stenosis without spinal stenosis, unchanged.  IMPRESSION: Unchanged  lumbar disc and facet degeneration resulting in mild-to-moderate lateral recess and neural foraminal stenosis as above.   Electronically Signed   By: Sebastian Ache M.D.   On: 06/16/2020 16:24  Cervical spine MRI10/15/2016  IMPRESSION: Severe right foraminal narrowing at C5-6 due to uncovertebral spurring. There is mild deformity of the ventral right hemicord at this level due to a disc osteophyte complex to the right.  Severe right and moderate to moderately severe left foraminal narrowing C6-7 due to uncovertebral disease.     Objective:  VS:  HT:    WT:   BMI:     BP:137/86  HR:81bpm  TEMP: ( )  RESP:  Physical Exam Vitals and nursing note reviewed.  Constitutional:       General: He is not in acute distress.    Appearance: He is well-developed.  HENT:     Head: Normocephalic and atraumatic.     Nose: Nose normal.     Mouth/Throat:     Mouth: Mucous membranes are moist.     Pharynx: Oropharynx is clear.  Eyes:     Conjunctiva/sclera: Conjunctivae normal.     Pupils: Pupils are equal, round, and reactive to light.  Neck:     Trachea: No tracheal deviation.  Cardiovascular:     Rate and Rhythm: Normal rate and regular rhythm.     Pulses: Normal pulses.  Pulmonary:     Effort: Pulmonary effort is normal.     Breath sounds: Normal breath sounds.  Abdominal:     General: There is no distension.     Palpations: Abdomen is soft.     Tenderness: There is no guarding or rebound.  Musculoskeletal:        General: Tenderness present. No deformity.     Cervical back: Normal range of motion and neck supple.     Right lower leg: No edema.     Left lower leg: No edema.     Comments: Patient has a lot of difficulty going from sit to stand pain is in the left anterior lateral hip and groin down to the knee when he stands.  He does have pain at end ranges of rotation internal and external.  He has more difficulty trying to get his shoe on and he demonstrates this to me really more than when I try to rotate them.  Right side has no pain and moves freely.  He does have pain with extension of the lumbar spine and facet loading.  He has some trigger points in the lower musculature.  Examination of both hands shows osteoarthritic changes of both hands particularly CMC joints and distal joints.  No synovitis noted.  Skin:    General: Skin is warm and dry.     Findings: No erythema or rash.  Neurological:     General: No focal deficit present.     Mental Status: He is alert and oriented to person, place, and time.     Sensory: No sensory deficit.     Motor: No weakness or abnormal muscle tone.     Coordination: Coordination normal.     Gait: Gait abnormal.  Psychiatric:         Mood and Affect: Mood normal.        Behavior: Behavior normal.        Thought Content: Thought content normal.      Imaging: XR C-ARM NO REPORT  Result Date: 07/04/2020 Please see Notes tab for imaging impression.

## 2020-07-24 ENCOUNTER — Encounter: Payer: Self-pay | Admitting: Orthopaedic Surgery

## 2020-07-24 ENCOUNTER — Ambulatory Visit (INDEPENDENT_AMBULATORY_CARE_PROVIDER_SITE_OTHER): Payer: No Typology Code available for payment source

## 2020-07-24 ENCOUNTER — Other Ambulatory Visit: Payer: Self-pay

## 2020-07-24 ENCOUNTER — Ambulatory Visit (INDEPENDENT_AMBULATORY_CARE_PROVIDER_SITE_OTHER): Payer: No Typology Code available for payment source | Admitting: Orthopaedic Surgery

## 2020-07-24 DIAGNOSIS — M1812 Unilateral primary osteoarthritis of first carpometacarpal joint, left hand: Secondary | ICD-10-CM

## 2020-07-24 DIAGNOSIS — M1612 Unilateral primary osteoarthritis, left hip: Secondary | ICD-10-CM | POA: Diagnosis not present

## 2020-07-24 DIAGNOSIS — G5602 Carpal tunnel syndrome, left upper limb: Secondary | ICD-10-CM | POA: Diagnosis not present

## 2020-07-24 MED ORDER — BUPIVACAINE HCL 0.5 % IJ SOLN
0.3300 mL | INTRAMUSCULAR | Status: AC | PRN
  Administered 2020-07-24: .33 mL

## 2020-07-24 MED ORDER — LIDOCAINE HCL 1 % IJ SOLN
0.3000 mL | INTRAMUSCULAR | Status: AC | PRN
  Administered 2020-07-24: .3 mL

## 2020-07-24 MED ORDER — METHYLPREDNISOLONE ACETATE 40 MG/ML IJ SUSP
13.3300 mg | INTRAMUSCULAR | Status: AC | PRN
  Administered 2020-07-24: 13.33 mg

## 2020-07-24 NOTE — Progress Notes (Signed)
Office Visit Note   Patient: Justin Saunders           Date of Birth: 10-Sep-1955           MRN: [id number] Visit Date: 07/24/2020              Requested by: Leola Brazil, DO [address] [place],  [place] [address] PCP: Leola Brazil, DO   Assessment & Plan: Visit Diagnoses:  1. Primary osteoarthritis of first carpometacarpal joint of left hand   2. Primary osteoarthritis of left hip   3. Left carpal tunnel syndrome     Plan: Impression is mild left hip OA and advanced left thumb basal joint arthritis.  For the left hip he will continue with periodic cortisone injections with Dr. Alvester Morin as these are still effective.  For the basal joint arthritis I recommend that he pursue surgical treatment once he has retired because I think the surgery would make it difficult to regain the grip strength that is required to do his job.  If he continues to be symptomatic after he retires from his job I think you would be an excellent candidate for a [facility] arthroplasty at that time.  For today an injection was performed and he tolerated this well.  Follow-up as needed.  Follow-Up Instructions: Return if symptoms worsen or fail to improve.   Orders:  Orders Placed This Encounter  Procedures  . XR Hand Complete Left   No orders of the defined types were placed in this encounter.     Procedures: Hand/UE Inj: L thumb CMC for osteoarthritis on 07/24/2020 4:47 PM Indications: pain Details: 25 G needle Medications: 0.3 mL lidocaine 1 %; 0.33 mL bupivacaine 0.5 %; 13.33 mg methylPREDNISolone acetate 40 MG/ML Outcome: tolerated well, no immediate complications      Clinical Data: No additional findings.   Subjective: Chief Complaint  Patient presents with  . Left Thumb - Pain    Pain in the [facility] Joint    Justin Saunders is a 65 year old gentleman comes in for evaluation of chronic left hip pain and left thumb pain.  He has been getting cortisone injections by Dr. Alvester Morin for his  left hip which have given him relief.  He also has thigh pain as well.  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  Denies any injuries.  He has had previous injections in his left thumb which last about 2 months.  This has been done at Dr. Olena Leatherwood office [facility]  He is right-hand dominant.   Review of Systems  Constitutional: Negative.   All other systems reviewed and are negative.    Objective: Vital Signs: There were no vitals taken for this visit.  Physical Exam Vitals and nursing note reviewed.  Constitutional:      Appearance: He is well-developed and well-nourished.  HENT:     Head: Normocephalic and atraumatic.  Eyes:     Pupils: Pupils are equal, round, and reactive to light.  Pulmonary:     Effort: Pulmonary effort is normal.  Abdominal:     Palpations: Abdomen is soft.  Musculoskeletal:        General: Normal range of motion.     Cervical back: Neck supple.  Skin:    General: Skin is warm.  Neurological:     Mental Status: He is alert and  oriented to person, place, and time.  Psychiatric:        Mood and Affect: Mood and affect normal.        Behavior: Behavior normal.        Thought Content: Thought content normal.        Judgment: Judgment normal.     Ortho Exam Left hip shows mild discomfort and pain with range of motion.  Good strength and gait.  Nontender laterally.  Left hand shows positive grind test.  Negative Finkelstein's.  No triggering.  Bossing of the base of the thumb metacarpal. Specialty Comments:  No specialty comments available.  Imaging: XR Hand Complete Left  Result Date: 07/24/2020 Advanced basal joint arthritis with slight subluxation of thumb metacarpal    PMFS History: Patient Active Problem List   Diagnosis Date Noted  . Perennial allergic rhinitis 01/16/2016  . Chronic sinusitis 01/16/2016  . Tobacco use 01/16/2016    Past Medical History:  Diagnosis Date  . Allergic rhinitis   . Back pain   . DDD (degenerative disc disease), cervical    thoracic and lumbar  . Heart disease   . Hyperlipidemia   . Hypertension   . Hypothyroidism   . Panic attacks   . Sinusitis     Family History  Problem Relation Age of Onset  . Allergic rhinitis Neg Hx   . Angioedema Neg Hx   . Asthma Neg Hx   . Eczema Neg Hx   . Immunodeficiency Neg Hx   . Urticaria Neg Hx     Past Surgical History:  Procedure Laterality Date  . APPENDECTOMY  1974  . CHOLECYSTECTOMY  2012  . epideral steroid injections     in cervical,thoracic and lumbar  . stents  7510,2585   3 heart stents 1st surgery,then 1 stent 2cd surgery  . TONSILLECTOMY     Social History   Occupational History  . Not on file  Tobacco Use  . Smoking status: Current Every Day Smoker    Packs/day: 1.00    Years: 42.00    Pack years: 42.00    Types: Cigarettes  . Smokeless tobacco: Never Used  Substance and Sexual Activity  . Alcohol use: No  . Drug use: No  . Sexual activity: Not on file

## 2020-09-03 ENCOUNTER — Ambulatory Visit: Payer: No Typology Code available for payment source | Admitting: Orthopaedic Surgery

## 2020-09-26 NOTE — Patient Instructions (Addendum)
DUE TO COVID-19 ONLY ONE VISITOR IS ALLOWED TO COME WITH YOU AND STAY IN THE WAITING ROOM ONLY DURING PRE OP AND PROCEDURE DAY OF SURGERY.   TWO VISITOR  MAY VISIT WITH YOU AFTER SURGERY IN YOUR PRIVATE ROOM DURING VISITING HOURS ONLY!  YOU NEED TO HAVE A COVID 19 TEST ON__3-18-22_____ @_______ , THIS TEST MUST BE DONE BEFORE SURGERY,  COVID TESTING SITE [address] [place] , IT IS ON THE RIGHT GOING OUT [address] APPROXIMATELY  2 MINUTES PAST [employer] ON THE RIGHT. ONCE YOUR COVID TEST IS COMPLETED,  PLEASE BEGIN THE QUARANTINE INSTRUCTIONS AS OUTLINED IN YOUR HANDOUT.                Justin Saunders  09/26/2020   Your procedure is scheduled on: 10-09-20   Report to [facility] Main  Entrance   Report to admitting at     1150   AM     Call this number if you have problems the morning of surgery [phone]    Remember: NO SOLID FOOD AFTER MIDNIGHT THE NIGHT PRIOR TO SURGERY. NOTHING BY MOUTH EXCEPT CLEAR LIQUIDS UNTIL    1020 am  Then nothing by mouth   CLEAR LIQUID DIET                                                               Water Black Coffee and tea, regular and decaf                              Plain Jell-O any favor except red or purple                                          Fruit ices (not with fruit pulp)                                      Iced Popsicles                                     Carbonated beverages, regular and diet                                    Cranberry, grape and apple juices Sports drinks like Gatorade Lightly seasoned clear broth or consume(fat free) Sugar, honey syrup   _____________________________________________________________________    BRUSH YOUR TEETH MORNING OF SURGERY AND RINSE YOUR MOUTH OUT, NO CHEWING GUM CANDY OR MINTS.     Take these medicines the morning of surgery with A SIP OF WATER: omeprazole, singulair, levothyroxine, nasal spray if needed, amlodipine,xanax                                 You may not have any metal on your body including hair pins and  piercings  Do not wear jewelry, lotions, powders or perfumes, deodorant              Men may shave face and neck.   Do not bring valuables to the hospital. [facility] IS NOT             RESPONSIBLE   FOR VALUABLES.  Contacts, dentures or bridgework may not be worn into surgery.      Patients discharged the day of surgery will not be allowed to drive home. IF YOU ARE HAVING SURGERY AND GOING HOME THE SAME DAY, YOU MUST HAVE AN ADULT TO DRIVE YOU HOME AND BE WITH YOU FOR 24 HOURS. YOU MAY GO HOME BY TAXI OR UBER OR ORTHERWISE, BUT AN ADULT MUST ACCOMPANY YOU HOME AND STAY WITH YOU FOR 24 HOURS.  Name and phone number of your driver:  Special Instructions: N/A              Please read over the following fact sheets you were given: _____________________________________________________________________ [facility] - Preparing for Surgery Before surgery, you can play an important role.  Because skin is not sterile, your skin needs to be as free of germs as possible.  You can reduce the number of germs on your skin by washing with CHG (chlorahexidine gluconate) soap before surgery.  CHG is an antiseptic cleaner which kills germs and bonds with the skin to continue killing germs even after washing. Please DO NOT use if you have an allergy to CHG or antibacterial soaps.  If your skin becomes reddened/irritated stop using the CHG and inform your nurse when you arrive at Short Stay. Do not shave (including legs and underarms) for at least 48 hours prior to the first CHG shower.  You may shave your face/neck. Please follow these instructions carefully:  1.  Shower with CHG Soap the night before surgery and the  morning of Surgery.  2.  If you choose to wash your hair, wash your hair first as usual with your  normal  shampoo.  3.  After you shampoo, rinse your hair and body thoroughly to remove the  shampoo.                            4.  Use CHG as you would any other liquid soap.  You can apply chg directly  to the skin and wash                       Gently with a scrungie or clean washcloth.  5.  Apply the CHG Soap to your body ONLY FROM THE NECK DOWN.   Do not use on face/ open                           Wound or open sores. Avoid contact with eyes, ears mouth and genitals (private parts).                       Wash face,  Genitals (private parts) with your normal soap.             6.  Wash thoroughly, paying special attention to the area where your surgery  will be performed.  7.  Thoroughly rinse your body with warm water from the neck down.  8.  DO NOT shower/wash with your normal soap after using and rinsing off  the CHG Soap.                9.  Pat yourself dry with a clean towel.            10.  Wear clean pajamas.            11.  Place clean sheets on your bed the night of your first shower and do not  sleep with pets. Day of Surgery : Do not apply any lotions/deodorants the morning of surgery.  Please wear clean clothes to the hospital/surgery center.  FAILURE TO FOLLOW THESE INSTRUCTIONS MAY RESULT IN THE CANCELLATION OF YOUR SURGERY PATIENT SIGNATURE_________________________________  NURSE SIGNATURE__________________________________  ________________________________________________________________________   [name]  An incentive spirometer is a tool that can help keep your lungs clear and active. This tool measures how well you are filling your lungs with each breath. Taking long deep breaths may help reverse or decrease the chance of developing breathing (pulmonary) problems (especially infection) following:  A long period of time when you are unable to move or be active. BEFORE THE PROCEDURE   If the spirometer includes an indicator to show your best effort, your nurse or respiratory therapist will set it to a desired goal.  If possible, sit up straight or lean  slightly forward. Try not to slouch.  Hold the incentive spirometer in an upright position. INSTRUCTIONS FOR USE  1. Sit on the edge of your bed if possible, or sit up as far as you can in bed or on a chair. 2. Hold the incentive spirometer in an upright position. 3. Breathe out normally. 4. Place the mouthpiece in your mouth and seal your lips tightly around it. 5. Breathe in slowly and as deeply as possible, raising the piston or the ball toward the top of the column. 6. Hold your breath for 3-5 seconds or for as long as possible. Allow the piston or ball to fall to the bottom of the column. 7. Remove the mouthpiece from your mouth and breathe out normally. 8. Rest for a few seconds and repeat Steps 1 through 7 at least 10 times every 1-2 hours when you are awake. Take your time and take a few normal breaths between deep breaths. 9. The spirometer may include an indicator to show your best effort. Use the indicator as a goal to work toward during each repetition. 10. After each set of 10 deep breaths, practice coughing to be sure your lungs are clear. If you have an incision (the cut made at the time of surgery), support your incision when coughing by placing a pillow or rolled up towels firmly against it. Once you are able to get out of bed, walk around indoors and cough well. You may stop using the incentive spirometer when instructed by your caregiver.  RISKS AND COMPLICATIONS  Take your time so you do not get dizzy or light-headed.  If you are in pain, you may need to take or ask for pain medication before doing incentive spirometry. It is harder to take a deep breath if you are having pain. AFTER USE  Rest and breathe slowly and easily.  It can be helpful to keep track of a log of your progress. Your caregiver can provide you with a simple table to help with this. If you are using the spirometer at home, follow these instructions: [place] IF:   You are having difficultly  using the spirometer.  You have trouble using the spirometer as  often as instructed.  Your pain medication is not giving enough relief while using the spirometer.  You develop fever of 100.5 F (38.1 C) or higher. SEEK IMMEDIATE MEDICAL CARE IF:   You cough up bloody sputum that had not been present before.  You develop fever of 102 F (38.9 C) or greater.  You develop worsening pain at or near the incision site. MAKE SURE YOU:   Understand these instructions.  Will watch your condition.  Will get help right away if you are not doing well or get worse. Document Released: 11/17/2006 Document Revised: 09/29/2011 Document Reviewed: 01/18/2007 [facility] Patient Information 2014 [place], [place].   ________________________________________________________________________

## 2020-09-26 NOTE — Progress Notes (Addendum)
PCP - Dr. Hope Pigeon [facility] Cardiologist - Dr. Rozell Searing 09-28-20 clearance  PPM/ICD -  Device Orders -  Rep Notified -   Chest x-ray -  EKG - 09-28-20 Stress Test - 09-27-20  Care everywhere ECHO -  Cardiac Cath -   Sleep Study -  CPAP -   Fasting Blood Sugar -  Checks Blood Sugar _____ times a day  Blood Thinner Instructions: Aspirin Instructions:  Asa 325 mg stop 7 days    ERAS Protcol - PRE-SURGERY  G2-   COVID TEST- 3-18  Activity--able to walk around home without SOB limited to to Hip pain Anesthesia review: CAD stents x4   Patient denies shortness of breath, fever, cough and chest pain at PAT appointment   All instructions explained to the patient, with a verbal understanding of the material. Patient agrees to go over the instructions while at home for a better understanding. Patient also instructed to self quarantine after being tested for COVID-19. The opportunity to ask questions was provided.

## 2020-09-28 ENCOUNTER — Other Ambulatory Visit: Payer: Self-pay

## 2020-09-28 ENCOUNTER — Encounter (HOSPITAL_COMMUNITY)
Admission: RE | Admit: 2020-09-28 | Discharge: 2020-09-28 | Disposition: A | Discharge status: Home / Self Care | Payer: Medicare Other | Source: Ambulatory Visit | Attending: Orthopedic Surgery | Admitting: Orthopedic Surgery

## 2020-09-28 ENCOUNTER — Encounter (HOSPITAL_COMMUNITY): Payer: Self-pay

## 2020-09-28 DIAGNOSIS — Z01818 Encounter for other preprocedural examination: Secondary | ICD-10-CM | POA: Insufficient documentation

## 2020-09-28 HISTORY — DX: Other intervertebral disc degeneration, lumbar region: M51.36

## 2020-09-28 HISTORY — DX: Atherosclerotic heart disease of native coronary artery without angina pectoris: I25.10

## 2020-09-28 HISTORY — DX: Depression, unspecified: F32.A

## 2020-09-28 HISTORY — DX: Headache, unspecified: R51.9

## 2020-09-28 HISTORY — DX: Other intervertebral disc degeneration, lumbar region without mention of lumbar back pain or lower extremity pain: M51.369

## 2020-09-28 HISTORY — DX: Prediabetes: R73.03

## 2020-09-28 HISTORY — DX: Inflammatory liver disease, unspecified: K75.9

## 2020-09-28 LAB — TYPE AND SCREEN
ABO/RH(D): A POS
Antibody Screen: NEGATIVE

## 2020-09-28 LAB — BASIC METABOLIC PANEL
Anion gap: 10 (ref 5–15)
BUN: 15 mg/dL (ref 8–23)
CO2: 25 mmol/L (ref 22–32)
Calcium: 9.3 mg/dL (ref 8.9–10.3)
Chloride: 108 mmol/L (ref 98–111)
Creatinine, Ser: 0.82 mg/dL (ref 0.61–1.24)
GFR, Estimated: >60 mL/min (ref >60)
Glucose, Bld: 97 mg/dL (ref 70–99)
Potassium: 4.1 mmol/L (ref 3.5–5.1)
Sodium: 143 mmol/L (ref 135–145)

## 2020-09-28 LAB — CBC
HCT: 37.7 % — ABNORMAL LOW (ref 39.0–52.0)
Hemoglobin: 12.7 g/dL — ABNORMAL LOW (ref 13.0–17.0)
MCH: 34.6 pg — ABNORMAL HIGH (ref 26.0–34.0)
MCHC: 33.7 g/dL (ref 30.0–36.0)
MCV: 102.7 fL — ABNORMAL HIGH (ref 80.0–100.0)
Platelets: 389 10*3/uL (ref 150–400)
RBC: 3.67 MIL/uL — ABNORMAL LOW (ref 4.22–5.81)
RDW: 14.3 % (ref 11.5–15.5)
WBC: 14.7 10*3/uL — ABNORMAL HIGH (ref 4.0–10.5)
nRBC: 0 % (ref 0.0–0.2)

## 2020-09-28 LAB — SURGICAL PCR SCREEN
MRSA, PCR: NEGATIVE
Staphylococcus aureus: NEGATIVE

## 2020-09-28 LAB — HEMOGLOBIN A1C
Hgb A1c MFr Bld: 5.5 % (ref 4.8–5.6)
Mean Plasma Glucose: 111.15 mg/dL

## 2020-10-01 NOTE — H&P (Signed)
TOTAL HIP ADMISSION H&P  Patient is admitted for left total hip arthroplasty.  Subjective:  Chief Complaint: left hip pain  HPI: Justin Saunders, 65 y.o. male, has a history of pain and functional disability in the left hip(s) due to arthritis and patient has failed non-surgical conservative treatments for greater than 12 weeks to include NSAID's and/or analgesics, use of assistive devices and activity modification.  Onset of symptoms was gradual starting 1 years ago with gradually worsening course since that time.The patient noted no past surgery on the left hip(s).  Patient currently rates pain in the left hip at 10 out of 10 with activity. Patient has night pain, worsening of pain with activity and weight bearing, pain that interfers with activities of daily living, pain with passive range of motion, crepitus and joint swelling. Patient has evidence of periarticular osteophytes and joint space narrowing by imaging studies. This condition presents safety issues increasing the risk of falls.   There is no current active infection.  Risks, benefits and expectations were discussed with the patient.  Risks including but not limited to the risk of anesthesia, blood clots, nerve damage, blood vessel damage, failure of the prosthesis, infection and up to and including death.  Patient understand the risks, benefits and expectations and wishes to proceed with surgery.   D/C Plans:       Home   Post-op Meds:       No Rx given  Tranexamic Acid:      To be given - IV   Decadron:      Is to be given  FYI:     ASA  .5-1 tramadol  No Celebrex, ok with Mobic  Avoid Iron    DME:    Rx sent for - RW & 3-N-1  PT:   HEP     Patient Active Problem List   Diagnosis Date Noted  . Perennial allergic rhinitis 01/16/2016  . Chronic sinusitis 01/16/2016  . Tobacco use 01/16/2016   Past Medical History:  Diagnosis Date  . Allergic rhinitis   . Back pain   . Coronary artery disease    4 stents  . DDD  (degenerative disc disease), cervical    thoracic and lumbar  . DDD (degenerative disc disease), lumbar   . Depression   . Headache   . Heart disease   . Hepatitis    C treated 15 years ago   . Hyperlipidemia   . Hypertension   . Hypothyroidism   . Panic attacks   . Pre-diabetes   . Sinusitis     Past Surgical History:  Procedure Laterality Date  . APPENDECTOMY  1974  . CHOLECYSTECTOMY  2012  . epideral steroid injections     in cervical,thoracic and lumbar  . stents  8413,2440   3 heart stents 1st surgery,then 1 stent 2cd surgery  . TONSILLECTOMY      No current facility-administered medications for this encounter.   Current Outpatient Medications  Medication Sig Dispense Refill Last Dose  . ALPRAZolam (XANAX) 1 MG tablet Take 1 mg by mouth in the morning, at noon, in the evening, and at bedtime.     Marland Kitchen amLODipine (NORVASC) 10 MG tablet Take 10 mg by mouth daily.     Marland Kitchen aspirin-acetaminophen-caffeine (EXCEDRIN MIGRAINE) 250-250-65 MG tablet Take 1 tablet by mouth every 6 (six) hours as needed for headache.     . Aspirin-Acetaminophen-Caffeine (GOODYS EXTRA STRENGTH) [phone] MG PACK Take 1 packet by mouth daily as needed (headaches).     Marland Kitchen  ipratropium (ATROVENT) 0.06 % nasal spray Place 2 sprays into both nostrils 2 (two) times daily as needed for rhinitis.     Marland Kitchen levothyroxine (SYNTHROID, LEVOTHROID) 25 MCG tablet Take 25 mcg by mouth daily before breakfast.     . lisinopril (PRINIVIL,ZESTRIL) 20 MG tablet Take 40 mg by mouth daily.     . meloxicam (MOBIC) 15 MG tablet Take 15 mg by mouth daily.     . metoprolol succinate (TOPROL-XL) 100 MG 24 hr tablet Take 50 mg by mouth daily.     . montelukast (SINGULAIR) 10 MG tablet Take 10 mg by mouth every morning.     Marland Kitchen omeprazole (PRILOSEC) 40 MG capsule Take 40 mg by mouth daily.      Allergies  Allergen Reactions  . Tylenol [Acetaminophen]     Causes headaches  . Codeine Nausea And Vomiting  . Hydromorphone Nausea And  Vomiting    Social History   Tobacco Use  . Smoking status: Current Every Day Smoker    Packs/day: 0.25    Years: 42.00    Pack years: 10.50    Types: Cigarettes  . Smokeless tobacco: Never Used  . Tobacco comment: pack every two weeks 4 cigs a day  Substance Use Topics  . Alcohol use: No    Family History  Problem Relation Age of Onset  . Allergic rhinitis Neg Hx   . Angioedema Neg Hx   . Asthma Neg Hx   . Eczema Neg Hx   . Immunodeficiency Neg Hx   . Urticaria Neg Hx      Review of Systems  Constitutional: Negative.   HENT: Negative.   Eyes: Negative.   Respiratory: Negative.   Cardiovascular: Negative.   Gastrointestinal: Positive for constipation.  Genitourinary: Negative.   Musculoskeletal: Positive for back pain and joint pain.  Skin: Negative.   Neurological: Negative.   Endo/Heme/Allergies: Negative.   Psychiatric/Behavioral: Negative.      Objective:  Physical Exam Constitutional:      Appearance: He is well-developed.  HENT:     Head: Normocephalic.  Eyes:     Pupils: Pupils are equal, round, and reactive to light.  Neck:     Thyroid: No thyromegaly.     Vascular: No JVD.     Trachea: No tracheal deviation.  Cardiovascular:     Rate and Rhythm: Normal rate and regular rhythm.  Pulmonary:     Effort: Pulmonary effort is normal. No respiratory distress.     Breath sounds: Normal breath sounds. No wheezing.  Abdominal:     Palpations: Abdomen is soft.     Tenderness: There is no abdominal tenderness. There is no guarding.  Musculoskeletal:     Cervical back: Neck supple.     Left hip: Tenderness and bony tenderness present. Decreased range of motion. Decreased strength.  Lymphadenopathy:     Cervical: No cervical adenopathy.  Skin:    General: Skin is warm and dry.  Neurological:     Mental Status: He is alert and oriented to person, place, and time.      Labs:   Estimated body mass index is 19.57 kg/m as calculated from the  following:   Height as of 09/28/20: 5\' 4"  (1.626 m).   Weight as of 09/28/20: 51.7 kg.   Imaging Review Plain radiographs demonstrate severe degenerative joint disease of the left hip(s). The bone quality appears to be good for age and reported activity level.      Assessment/Plan:  End stage arthritis,  left hip(s)  The patient history, physical examination, clinical judgement of the provider and imaging studies are consistent with end stage degenerative joint disease of the left hip(s) and total hip arthroplasty is deemed medically necessary. The treatment options including medical management, injection therapy, arthroscopy and arthroplasty were discussed at length. The risks and benefits of total hip arthroplasty were presented and reviewed. The risks due to aseptic loosening, infection, stiffness, dislocation/subluxation,  thromboembolic complications and other imponderables were discussed.  The patient acknowledged the explanation, agreed to proceed with the plan and consent was signed. Patient is being admitted for treatment for surgery, pain control, PT, OT, prophylactic antibiotics, VTE prophylaxis, progressive ambulation and ADL's and discharge planning.The patient is planning to be discharged home.

## 2020-10-01 NOTE — Progress Notes (Signed)
Anesthesia Chart Review   Case: [id number] Date/Time: 10/09/20 1405   Procedure: TOTAL HIP ARTHROPLASTY ANTERIOR APPROACH (Left Hip) - 70 mins   Anesthesia type: Spinal   Pre-op diagnosis: Left hip avascular necrosis   Location: [facility] ROOM 10 / [facility]   Surgeons: Durene Romans, MD      DISCUSSION:64 y.o. current every day smoker with h/o HTN, CAD (stents 2008), left hip AVN scheduled for above procedure 10/09/20 with Dr. Durene Romans.   Pt last seen by cardiology 09/20/2020. Per OV note, "Overall he seems to be managing well from a cardiac standpoint. He is not describing any anginal type symptoms today. I will obtain a Lexiscan for preoperative testing for his upcoming hip replacement surgery."  Stress test 09/27/2020 uneventful.   VS: BP 111/65    Pulse 61    Temp 36.9 C (Oral)    Resp 17    Ht 5\' 4"  (1.626 m)    Wt 51.7 kg    SpO2 97%    BMI 19.57 kg/m   PROVIDERS: , DO  Is PCP   LABS: Labs reviewed: Acceptable for surgery. (all labs ordered are listed, but only abnormal results are displayed)  Labs Reviewed  CBC - Abnormal; Notable for the following components:      Result Value   WBC 14.7 (*)    RBC 3.67 (*)    Hemoglobin 12.7 (*)    HCT 37.7 (*)    MCV 102.7 (*)    MCH 34.6 (*)    All other components within normal limits  SURGICAL PCR SCREEN  BASIC METABOLIC PANEL  HEMOGLOBIN A1C  TYPE AND SCREEN     IMAGES:   EKG: 09/28/20 Rate 49 bpm  Sinus bradycardia  Otherwise normal ECG No old tracing to compare   CV:  Past Medical History:  Diagnosis Date   Allergic rhinitis    Back pain    Coronary artery disease    4 stents   DDD (degenerative disc disease), cervical    thoracic and lumbar   DDD (degenerative disc disease), lumbar    Depression    Headache    Heart disease    Hepatitis    C treated 15 years ago    Hyperlipidemia    Hypertension    Hypothyroidism    Panic attacks    Pre-diabetes    Sinusitis     Past  Surgical History:  Procedure Laterality Date   APPENDECTOMY  1974   CHOLECYSTECTOMY  2012   epideral steroid injections     in cervical,thoracic and lumbar   stents  2008,2009   3 heart stents 1st surgery,then 1 stent 2cd surgery   TONSILLECTOMY      MEDICATIONS:  ALPRAZolam (XANAX) 1 MG tablet   amLODipine (NORVASC) 10 MG tablet   aspirin-acetaminophen-caffeine (EXCEDRIN MIGRAINE) 250-250-65 MG tablet   Aspirin-Acetaminophen-Caffeine (GOODYS EXTRA STRENGTH) 500-325-65 MG PACK   ipratropium (ATROVENT) 0.06 % nasal spray   levothyroxine (SYNTHROID, LEVOTHROID) 25 MCG tablet   lisinopril (PRINIVIL,ZESTRIL) 20 MG tablet   meloxicam (MOBIC) 15 MG tablet   metoprolol succinate (TOPROL-XL) 100 MG 24 hr tablet   montelukast (SINGULAIR) 10 MG tablet   omeprazole (PRILOSEC) 40 MG capsule   No current facility-administered medications for this encounter.    03-05-1997, PA-C [facility] Pre-Surgical Testing [phone]

## 2020-10-03 NOTE — Anesthesia Preprocedure Evaluation (Addendum)
Anesthesia Evaluation  Patient identified by MRN, date of birth, ID band Patient awake    Reviewed: Allergy & Precautions, NPO status , Patient's Chart, lab work & pertinent test results, reviewed documented beta blocker date and time   Airway Mallampati: II  TM Distance: >3 FB Neck ROM: Full    Dental  (+) Dental Advisory Given, Missing, Poor Dentition   Pulmonary Current Smoker and Patient abstained from smoking.,    Pulmonary exam normal breath sounds clear to auscultation       Cardiovascular hypertension, Pt. on medications and Pt. on home beta blockers + CAD and + Cardiac Stents  Normal cardiovascular exam Rhythm:Regular Rate:Normal  CAD Stents RCA and LCx 07/2006 OM1   [facility]  EKG 09/28/20 Sinus Bradycardia, otherwise normal   Neuro/Psych  Headaches, PSYCHIATRIC DISORDERS Anxiety Depression Hx/o panic attacks   GI/Hepatic negative GI ROS, (+) Hepatitis -, CTreated 15 years ago   Endo/Other  Hypothyroidism Hyperlipidemia  Renal/GU negative Renal ROS  negative genitourinary   Musculoskeletal  (+) Arthritis , Osteoarthritis,  AVN left hip   Abdominal   Peds  Hematology negative hematology ROS (+)   Anesthesia Other Findings   Reproductive/Obstetrics                           Anesthesia Physical Anesthesia Plan  ASA: III  Anesthesia Plan: Spinal   Post-op Pain Management:    Induction: Intravenous  PONV Risk Score and Plan: 1 and Ondansetron, Treatment may vary due to age or medical condition, Midazolam and Propofol infusion  Airway Management Planned: Natural Airway and Simple Face Mask  Additional Equipment:   Intra-op Plan:   Post-operative Plan:   Informed Consent: I have reviewed the patients History and Physical, chart, labs and discussed the procedure including the risks, benefits and alternatives for the proposed anesthesia  with the patient or authorized representative who has indicated his/her understanding and acceptance.     Dental advisory given  Plan Discussed with: CRNA and Anesthesiologist  Anesthesia Plan Comments: (See PAT note 09/28/20, Jodell Cipro, PA-C)       Anesthesia Quick Evaluation

## 2020-10-05 ENCOUNTER — Other Ambulatory Visit (HOSPITAL_COMMUNITY)
Admission: RE | Admit: 2020-10-05 | Discharge: 2020-10-05 | Disposition: A | Discharge status: Home / Self Care | Payer: Medicare Other | Source: Ambulatory Visit | Attending: Orthopedic Surgery | Admitting: Orthopedic Surgery

## 2020-10-05 DIAGNOSIS — Z20822 Contact with and (suspected) exposure to covid-19: Secondary | ICD-10-CM | POA: Diagnosis not present

## 2020-10-05 DIAGNOSIS — Z01812 Encounter for preprocedural laboratory examination: Secondary | ICD-10-CM | POA: Diagnosis present

## 2020-10-06 LAB — SARS CORONAVIRUS 2 (TAT 6-24 HRS): SARS Coronavirus 2: NEGATIVE

## 2020-10-09 ENCOUNTER — Ambulatory Visit (HOSPITAL_COMMUNITY): Payer: Medicare Other

## 2020-10-09 ENCOUNTER — Observation Stay (HOSPITAL_COMMUNITY)
Admission: RE | Admit: 2020-10-09 | Discharge: 2020-10-10 | Disposition: A | Discharge status: Home / Self Care | Payer: Medicare Other | Attending: Orthopedic Surgery | Admitting: Orthopedic Surgery

## 2020-10-09 ENCOUNTER — Ambulatory Visit (HOSPITAL_COMMUNITY): Payer: Medicare Other | Admitting: Physician Assistant

## 2020-10-09 ENCOUNTER — Encounter (HOSPITAL_COMMUNITY): Payer: Self-pay | Admitting: Orthopedic Surgery

## 2020-10-09 ENCOUNTER — Other Ambulatory Visit: Payer: Self-pay

## 2020-10-09 ENCOUNTER — Encounter (HOSPITAL_COMMUNITY)
Admission: RE | Disposition: A | Discharge status: Home / Self Care | Payer: Self-pay | Source: Home / Self Care | Attending: Orthopedic Surgery

## 2020-10-09 ENCOUNTER — Ambulatory Visit (HOSPITAL_COMMUNITY): Payer: Medicare Other | Admitting: Certified Registered Nurse Anesthetist

## 2020-10-09 DIAGNOSIS — E039 Hypothyroidism, unspecified: Secondary | ICD-10-CM | POA: Insufficient documentation

## 2020-10-09 DIAGNOSIS — M1612 Unilateral primary osteoarthritis, left hip: Principal | ICD-10-CM | POA: Insufficient documentation

## 2020-10-09 DIAGNOSIS — Z79899 Other long term (current) drug therapy: Secondary | ICD-10-CM | POA: Diagnosis not present

## 2020-10-09 DIAGNOSIS — I2510 Atherosclerotic heart disease of native coronary artery without angina pectoris: Secondary | ICD-10-CM | POA: Diagnosis not present

## 2020-10-09 DIAGNOSIS — F17210 Nicotine dependence, cigarettes, uncomplicated: Secondary | ICD-10-CM | POA: Insufficient documentation

## 2020-10-09 DIAGNOSIS — I10 Essential (primary) hypertension: Secondary | ICD-10-CM | POA: Diagnosis not present

## 2020-10-09 DIAGNOSIS — Z96649 Presence of unspecified artificial hip joint: Secondary | ICD-10-CM

## 2020-10-09 DIAGNOSIS — Z419 Encounter for procedure for purposes other than remedying health state, unspecified: Secondary | ICD-10-CM

## 2020-10-09 DIAGNOSIS — Z96642 Presence of left artificial hip joint: Secondary | ICD-10-CM

## 2020-10-09 DIAGNOSIS — R7303 Prediabetes: Secondary | ICD-10-CM | POA: Insufficient documentation

## 2020-10-09 HISTORY — PX: TOTAL HIP ARTHROPLASTY: SHX124

## 2020-10-09 LAB — ABO/RH: ABO/RH(D): A POS

## 2020-10-09 SURGERY — ARTHROPLASTY, HIP, TOTAL, ANTERIOR APPROACH
Anesthesia: Spinal | Site: Hip | Laterality: Left

## 2020-10-09 MED ORDER — DOCUSATE SODIUM 100 MG PO CAPS
100.0000 mg | ORAL_CAPSULE | Freq: Two times a day (BID) | ORAL | Status: DC
  Administered 2020-10-09 – 2020-10-10 (×2): 100 mg via ORAL
  Filled 2020-10-09 (×2): qty 1

## 2020-10-09 MED ORDER — TRAMADOL HCL 50 MG PO TABS
25.0000 mg | ORAL_TABLET | Freq: Four times a day (QID) | ORAL | Status: DC | PRN
Start: 2020-10-09 — End: 2020-10-10
  Administered 2020-10-09 (×2): 25 mg via ORAL
  Administered 2020-10-10: 50 mg via ORAL
  Filled 2020-10-09 (×5): qty 1

## 2020-10-09 MED ORDER — AMLODIPINE BESYLATE 10 MG PO TABS
10.0000 mg | ORAL_TABLET | Freq: Every day | ORAL | Status: DC
  Administered 2020-10-10: 10 mg via ORAL
  Filled 2020-10-09: qty 1

## 2020-10-09 MED ORDER — EPHEDRINE SULFATE-NACL 50-0.9 MG/10ML-% IV SOSY
PREFILLED_SYRINGE | INTRAVENOUS | Status: DC | PRN
  Administered 2020-10-09 (×2): 10 mg via INTRAVENOUS

## 2020-10-09 MED ORDER — DEXAMETHASONE SODIUM PHOSPHATE 10 MG/ML IJ SOLN
10.0000 mg | Freq: Once | INTRAMUSCULAR | Status: AC
  Administered 2020-10-09: 8 mg via INTRAVENOUS

## 2020-10-09 MED ORDER — SODIUM CHLORIDE 0.9 % IV SOLN: INTRAVENOUS | Status: DC

## 2020-10-09 MED ORDER — BUPIVACAINE IN DEXTROSE 0.75-8.25 % IT SOLN
INTRATHECAL | Status: DC | PRN
  Administered 2020-10-09: 1.5 mL via INTRATHECAL

## 2020-10-09 MED ORDER — ASPIRIN 81 MG PO CHEW
81.0000 mg | CHEWABLE_TABLET | Freq: Two times a day (BID) | ORAL | Status: DC
  Administered 2020-10-09 – 2020-10-10 (×2): 81 mg via ORAL
  Filled 2020-10-09 (×2): qty 1

## 2020-10-09 MED ORDER — METOCLOPRAMIDE HCL 5 MG PO TABS
5.0000 mg | ORAL_TABLET | Freq: Three times a day (TID) | ORAL | Status: DC | PRN
Start: 2020-10-09 — End: 2020-10-10

## 2020-10-09 MED ORDER — IPRATROPIUM BROMIDE 0.06 % NA SOLN
2.0000 | Freq: Two times a day (BID) | NASAL | Status: DC | PRN
  Filled 2020-10-09: qty 15

## 2020-10-09 MED ORDER — GLYCOPYRROLATE PF 0.2 MG/ML IJ SOSY
PREFILLED_SYRINGE | INTRAMUSCULAR | Status: DC | PRN
  Administered 2020-10-09: .2 mg via INTRAVENOUS

## 2020-10-09 MED ORDER — TRANEXAMIC ACID-NACL 1000-0.7 MG/100ML-% IV SOLN
1000.0000 mg | Freq: Once | INTRAVENOUS | Status: AC
  Administered 2020-10-09: 1000 mg via INTRAVENOUS
  Filled 2020-10-09: qty 100

## 2020-10-09 MED ORDER — METOPROLOL SUCCINATE ER 50 MG PO TB24
50.0000 mg | ORAL_TABLET | Freq: Every day | ORAL | Status: DC
  Administered 2020-10-10: 50 mg via ORAL
  Filled 2020-10-09: qty 1

## 2020-10-09 MED ORDER — OXYCODONE HCL 5 MG/5ML PO SOLN: 5.0000 mg | Freq: Once | ORAL | Status: DC | PRN

## 2020-10-09 MED ORDER — DEXAMETHASONE SODIUM PHOSPHATE 10 MG/ML IJ SOLN
10.0000 mg | Freq: Once | INTRAMUSCULAR | Status: AC
  Administered 2020-10-10: 10 mg via INTRAVENOUS
  Filled 2020-10-09: qty 1

## 2020-10-09 MED ORDER — METHOCARBAMOL 1000 MG/10ML IJ SOLN
500.0000 mg | Freq: Four times a day (QID) | INTRAVENOUS | Status: DC | PRN
  Filled 2020-10-09: qty 5

## 2020-10-09 MED ORDER — FENTANYL CITRATE (PF) 100 MCG/2ML IJ SOLN
25.0000 ug | INTRAMUSCULAR | Status: DC | PRN
Start: 2020-10-09 — End: 2020-10-09
  Administered 2020-10-09: 50 ug via INTRAVENOUS

## 2020-10-09 MED ORDER — STERILE WATER FOR IRRIGATION IR SOLN
Status: DC | PRN
  Administered 2020-10-09: 2000 mL

## 2020-10-09 MED ORDER — PHENOL 1.4 % MT LIQD: 1.0000 | OROMUCOSAL | Status: DC | PRN

## 2020-10-09 MED ORDER — PROPOFOL 10 MG/ML IV BOLUS
INTRAVENOUS | Status: AC
  Filled 2020-10-09: qty 20

## 2020-10-09 MED ORDER — BISACODYL 10 MG RE SUPP: 10.0000 mg | Freq: Every day | RECTAL | Status: DC | PRN

## 2020-10-09 MED ORDER — SODIUM CHLORIDE 0.9 % IR SOLN
Status: DC | PRN
  Administered 2020-10-09: 1000 mL

## 2020-10-09 MED ORDER — MONTELUKAST SODIUM 10 MG PO TABS
10.0000 mg | ORAL_TABLET | Freq: Every day | ORAL | Status: DC
  Administered 2020-10-10: 10 mg via ORAL
  Filled 2020-10-09: qty 1

## 2020-10-09 MED ORDER — TRANEXAMIC ACID-NACL 1000-0.7 MG/100ML-% IV SOLN
1000.0000 mg | INTRAVENOUS | Status: AC
  Administered 2020-10-09: 1000 mg via INTRAVENOUS
  Filled 2020-10-09: qty 100

## 2020-10-09 MED ORDER — PROPOFOL 1000 MG/100ML IV EMUL
INTRAVENOUS | Status: AC
  Filled 2020-10-09: qty 100

## 2020-10-09 MED ORDER — ORAL CARE MOUTH RINSE: 15.0000 mL | Freq: Once | OROMUCOSAL | Status: AC

## 2020-10-09 MED ORDER — MORPHINE SULFATE (PF) 2 MG/ML IV SOLN
0.5000 mg | INTRAVENOUS | Status: DC | PRN
Start: 2020-10-09 — End: 2020-10-10
  Administered 2020-10-09 – 2020-10-10 (×3): 1 mg via INTRAVENOUS
  Filled 2020-10-09 (×3): qty 1

## 2020-10-09 MED ORDER — PHENYLEPHRINE HCL-NACL 10-0.9 MG/250ML-% IV SOLN
INTRAVENOUS | Status: DC | PRN
  Administered 2020-10-09: 50 ug/min via INTRAVENOUS

## 2020-10-09 MED ORDER — MENTHOL 3 MG MT LOZG: 1.0000 | LOZENGE | OROMUCOSAL | Status: DC | PRN

## 2020-10-09 MED ORDER — MELOXICAM 15 MG PO TABS
15.0000 mg | ORAL_TABLET | Freq: Every day | ORAL | Status: DC
  Filled 2020-10-09: qty 1

## 2020-10-09 MED ORDER — CEFAZOLIN SODIUM-DEXTROSE 2-4 GM/100ML-% IV SOLN
2.0000 g | INTRAVENOUS | Status: AC
  Administered 2020-10-09: 2 g via INTRAVENOUS
  Filled 2020-10-09: qty 100

## 2020-10-09 MED ORDER — CHLORHEXIDINE GLUCONATE 0.12 % MT SOLN
15.0000 mL | Freq: Once | OROMUCOSAL | Status: AC
  Administered 2020-10-09: 15 mL via OROMUCOSAL

## 2020-10-09 MED ORDER — ALPRAZOLAM 1 MG PO TABS
1.0000 mg | ORAL_TABLET | Freq: Four times a day (QID) | ORAL | Status: DC | PRN
  Administered 2020-10-09 – 2020-10-10 (×2): 1 mg via ORAL
  Filled 2020-10-09 (×2): qty 1

## 2020-10-09 MED ORDER — PROPOFOL 500 MG/50ML IV EMUL
INTRAVENOUS | Status: DC | PRN
  Administered 2020-10-09: 100 ug/kg/min via INTRAVENOUS

## 2020-10-09 MED ORDER — LIDOCAINE 2% (20 MG/ML) 5 ML SYRINGE
INTRAMUSCULAR | Status: DC | PRN
  Administered 2020-10-09: 40 mg via INTRAVENOUS

## 2020-10-09 MED ORDER — LISINOPRIL 20 MG PO TABS
40.0000 mg | ORAL_TABLET | Freq: Every day | ORAL | Status: DC
  Administered 2020-10-10: 40 mg via ORAL
  Filled 2020-10-09: qty 2

## 2020-10-09 MED ORDER — MIDAZOLAM HCL 5 MG/5ML IJ SOLN
INTRAMUSCULAR | Status: DC | PRN
  Administered 2020-10-09: 2 mg via INTRAVENOUS

## 2020-10-09 MED ORDER — FENTANYL CITRATE (PF) 100 MCG/2ML IJ SOLN
INTRAMUSCULAR | Status: AC
  Filled 2020-10-09: qty 2

## 2020-10-09 MED ORDER — PHENYLEPHRINE HCL (PRESSORS) 10 MG/ML IV SOLN
INTRAVENOUS | Status: AC
  Filled 2020-10-09: qty 1

## 2020-10-09 MED ORDER — PANTOPRAZOLE SODIUM 40 MG PO TBEC
40.0000 mg | DELAYED_RELEASE_TABLET | Freq: Every day | ORAL | Status: DC
  Administered 2020-10-10: 40 mg via ORAL
  Filled 2020-10-09: qty 1

## 2020-10-09 MED ORDER — PROPOFOL 10 MG/ML IV BOLUS
INTRAVENOUS | Status: DC | PRN
  Administered 2020-10-09: 40 mg via INTRAVENOUS

## 2020-10-09 MED ORDER — CEFAZOLIN SODIUM-DEXTROSE 2-4 GM/100ML-% IV SOLN
2.0000 g | Freq: Four times a day (QID) | INTRAVENOUS | Status: AC
  Administered 2020-10-09 – 2020-10-10 (×2): 2 g via INTRAVENOUS
  Filled 2020-10-09 (×2): qty 100

## 2020-10-09 MED ORDER — ONDANSETRON HCL 4 MG/2ML IJ SOLN
4.0000 mg | Freq: Four times a day (QID) | INTRAMUSCULAR | Status: DC | PRN
  Administered 2020-10-09: 4 mg via INTRAVENOUS
  Filled 2020-10-09: qty 2

## 2020-10-09 MED ORDER — METHOCARBAMOL 500 MG PO TABS
500.0000 mg | ORAL_TABLET | Freq: Four times a day (QID) | ORAL | Status: DC | PRN
  Administered 2020-10-09 – 2020-10-10 (×2): 500 mg via ORAL
  Filled 2020-10-09 (×2): qty 1

## 2020-10-09 MED ORDER — OXYCODONE HCL 5 MG PO TABS: 5.0000 mg | ORAL_TABLET | Freq: Once | ORAL | Status: DC | PRN

## 2020-10-09 MED ORDER — LACTATED RINGERS IV SOLN: INTRAVENOUS | Status: DC

## 2020-10-09 MED ORDER — MIDAZOLAM HCL 2 MG/2ML IJ SOLN
INTRAMUSCULAR | Status: AC
  Filled 2020-10-09: qty 2

## 2020-10-09 MED ORDER — METOCLOPRAMIDE HCL 5 MG/ML IJ SOLN
5.0000 mg | Freq: Three times a day (TID) | INTRAMUSCULAR | Status: DC | PRN
Start: 2020-10-09 — End: 2020-10-10
  Administered 2020-10-10: 5 mg via INTRAVENOUS
  Filled 2020-10-09: qty 2

## 2020-10-09 MED ORDER — POLYETHYLENE GLYCOL 3350 17 G PO PACK: 17.0000 g | PACK | Freq: Every day | ORAL | Status: DC | PRN

## 2020-10-09 MED ORDER — PHENYLEPHRINE 40 MCG/ML (10ML) SYRINGE FOR IV PUSH (FOR BLOOD PRESSURE SUPPORT)
PREFILLED_SYRINGE | INTRAVENOUS | Status: DC | PRN
  Administered 2020-10-09 (×3): 200 ug via INTRAVENOUS

## 2020-10-09 MED ORDER — ONDANSETRON HCL 4 MG PO TABS
4.0000 mg | ORAL_TABLET | Freq: Four times a day (QID) | ORAL | Status: DC | PRN
  Administered 2020-10-10: 4 mg via ORAL
  Filled 2020-10-09: qty 1

## 2020-10-09 MED ORDER — ONDANSETRON HCL 4 MG/2ML IJ SOLN
INTRAMUSCULAR | Status: DC | PRN
  Administered 2020-10-09: 4 mg via INTRAVENOUS

## 2020-10-09 MED ORDER — LEVOTHYROXINE SODIUM 25 MCG PO TABS
25.0000 ug | ORAL_TABLET | Freq: Every day | ORAL | Status: DC
  Administered 2020-10-10: 25 ug via ORAL
  Filled 2020-10-09: qty 1

## 2020-10-09 SURGICAL SUPPLY — 39 items
BAG DECANTER FOR FLEXI CONT (MISCELLANEOUS) IMPLANT
BAG ZIPLOCK 12X15 (MISCELLANEOUS) ×2 IMPLANT
BLADE SAG 18X100X1.27 (BLADE) ×2 IMPLANT
COVER PERINEAL POST (MISCELLANEOUS) ×2 IMPLANT
COVER SURGICAL LIGHT HANDLE (MISCELLANEOUS) ×2 IMPLANT
COVER WAND RF STERILE (DRAPES) IMPLANT
CUP ACETBLR 54 OD PINNACLE (Hips) ×2 IMPLANT
DERMABOND ADVANCED (GAUZE/BANDAGES/DRESSINGS) ×1
DERMABOND ADVANCED .7 DNX12 (GAUZE/BANDAGES/DRESSINGS) ×1 IMPLANT
DRAPE STERI IOBAN 125X83 (DRAPES) ×2 IMPLANT
DRAPE U-SHAPE 47X51 STRL (DRAPES) ×4 IMPLANT
DRESSING AQUACEL AG SP 3.5X10 (GAUZE/BANDAGES/DRESSINGS) ×1 IMPLANT
DRSG AQUACEL AG SP 3.5X10 (GAUZE/BANDAGES/DRESSINGS) ×2
DURAPREP 26ML APPLICATOR (WOUND CARE) ×2 IMPLANT
ELECT REM PT RETURN 15FT ADLT (MISCELLANEOUS) ×2 IMPLANT
ELIMINATOR HOLE APEX DEPUY (Hips) ×2 IMPLANT
GLOVE ORTHO TXT STRL SZ7.5 (GLOVE) ×4 IMPLANT
GLOVE SURG ENC MOIS LTX SZ6 (GLOVE) ×4 IMPLANT
GLOVE SURG LTX SZ8 (GLOVE) ×4 IMPLANT
GLOVE SURG UNDER POLY LF SZ6.5 (GLOVE) ×2 IMPLANT
GLOVE SURG UNDER POLY LF SZ7.5 (GLOVE) ×2 IMPLANT
GOWN STRL REUS W/TWL LRG LVL3 (GOWN DISPOSABLE) ×4 IMPLANT
HEAD CERAMIC 36 PLUS5 (Hips) ×2 IMPLANT
HOLDER FOLEY CATH W/STRAP (MISCELLANEOUS) ×2 IMPLANT
KIT TURNOVER KIT A (KITS) ×2 IMPLANT
LINER NEUTRAL 54X36MM PLUS 4 (Hips) ×2 IMPLANT
PACK ANTERIOR HIP CUSTOM (KITS) ×2 IMPLANT
PENCIL SMOKE EVACUATOR (MISCELLANEOUS) ×2 IMPLANT
SCREW 6.5MMX25MM (Screw) ×2 IMPLANT
STEM FEMORAL SZ6 HIGH ACTIS (Stem) ×2 IMPLANT
SUT MNCRL AB 4-0 PS2 18 (SUTURE) ×2 IMPLANT
SUT STRATAFIX 0 PDS 27 VIOLET (SUTURE) ×2
SUT VIC AB 1 CT1 36 (SUTURE) ×6 IMPLANT
SUT VIC AB 2-0 CT1 27 (SUTURE) ×4
SUT VIC AB 2-0 CT1 TAPERPNT 27 (SUTURE) ×2 IMPLANT
SUTURE STRATFX 0 PDS 27 VIOLET (SUTURE) ×1 IMPLANT
TRAY FOLEY MTR SLVR 16FR STAT (SET/KITS/TRAYS/PACK) ×2 IMPLANT
TUBE SUCTION HIGH CAP CLEAR NV (SUCTIONS) ×4 IMPLANT
WATER STERILE IRR 1000ML POUR (IV SOLUTION) ×2 IMPLANT

## 2020-10-09 NOTE — Discharge Instructions (Signed)

## 2020-10-09 NOTE — Interval H&P Note (Signed)
History and Physical Interval Note:  10/09/2020 12:41 PM  Justin Saunders  has presented today for surgery, with the diagnosis of Left hip avascular necrosis.  The various methods of treatment have been discussed with the patient and family. After consideration of risks, benefits and other options for treatment, the patient has consented to  Procedure(s) with comments: TOTAL HIP ARTHROPLASTY ANTERIOR APPROACH (Left) - 70 mins as a surgical intervention.  The patient's history has been reviewed, patient examined, no change in status, stable for surgery.  I have reviewed the patient's chart and labs.  Questions were answered to the patient's satisfaction.     Shelda Pal

## 2020-10-09 NOTE — Transfer of Care (Signed)
Immediate Anesthesia Transfer of Care Note  Patient: Justin Saunders  Procedure(s) Performed: TOTAL HIP ARTHROPLASTY ANTERIOR APPROACH (Left Hip)  Patient Location: PACU  Anesthesia Type:Spinal  Level of Consciousness: awake, alert  and oriented  Airway & Oxygen Therapy: Patient Spontanous Breathing and Patient connected to face mask  Post-op Assessment: Report given to RN and Post -op Vital signs reviewed and stable  Post vital signs: Reviewed and stable  Last Vitals:  Vitals Value Taken Time  BP 129/77 10/09/20 1600  Temp    Pulse 86 10/09/20 1605  Resp 15 10/09/20 1605  SpO2 100 % 10/09/20 1605  Vitals shown include unvalidated device data.  Last Pain:  Vitals:   10/09/20 1201  TempSrc: Oral  PainSc:       Patients Stated Pain Goal: 1 (10/09/20 1155)  Complications: No complications documented.

## 2020-10-09 NOTE — Op Note (Signed)
NAME:  Justin Saunders                ACCOUNT NO.: [id number]      MEDICAL RECORD NO.: [id number]      FACILITY:  [facility]      PHYSICIAN:  Shelda Pal  DATE OF BIRTH:  October 02, 1955     DATE OF PROCEDURE:  10/09/2020                                 OPERATIVE REPORT         PREOPERATIVE DIAGNOSIS: Left  hip osteoarthritis.      POSTOPERATIVE DIAGNOSIS:  Left hip osteoarthritis.      PROCEDURE:  Left total hip replacement through an anterior approach   utilizing DePuy THR system, component size 54 mm pinnacle cup, a size 36+4 neutral   Altrex liner, a size 6 Hi Actis stem with a 36+5 delta ceramic   ball.      SURGEON:  Madlyn Frankel. Charlann Boxer, M.D.      ASSISTANT:  Dennie Bible, PA-C     ANESTHESIA:  Spinal.      SPECIMENS:  None.      COMPLICATIONS:  None.      BLOOD LOSS:  325 cc     DRAINS:  None.      INDICATION OF THE PROCEDURE:  Justin Saunders is a 65 y.o. male who had   presented to office for evaluation of left hip pain.  Radiographs revealed   progressive degenerative changes with bone-on-bone   articulation of the  hip joint, including subchondral cystic changes and osteophytes.  The patient had painful limited range of   motion significantly affecting their overall quality of life and function.  The patient was failing to    respond to conservative measures including medications and/or injections and activity modification and at this point was ready   to proceed with more definitive measures.  Consent was obtained for   benefit of pain relief.  Specific risks of infection, DVT, component   failure, dislocation, neurovascular injury, and need for revision surgery were reviewed in the office as well discussion of   the anterior versus posterior approach were reviewed.     PROCEDURE IN DETAIL:  The patient was brought to operative theater.   Once adequate anesthesia, preoperative antibiotics, 2 gm of Ancef, 1 gm of Tranexamic Acid, and 10 mg of  Decadron were administered, the patient was positioned supine on the [employer] table.  Once the patient was safely positioned with adequate padding of boney prominences we predraped out the hip, and used fluoroscopy to confirm orientation of the pelvis.      The left hip was then prepped and draped from proximal iliac crest to   mid thigh with a shower curtain technique.      Time-out was performed identifying the patient, planned procedure, and the appropriate extremity.     An incision was then made 2 cm lateral to the   anterior superior iliac spine extending over the orientation of the   tensor fascia lata muscle and sharp dissection was carried down to the   fascia of the muscle.      The fascia was then incised.  The muscle belly was identified and swept   laterally and retractor placed along the superior neck.  Following   cauterization of the circumflex vessels and removing some  pericapsular   fat, a second cobra retractor was placed on the inferior neck.  A T-capsulotomy was made along the line of the   superior neck to the trochanteric fossa, then extended proximally and   distally.  Tag sutures were placed and the retractors were then placed   intracapsular.  We then identified the trochanteric fossa and   orientation of my neck cut and then made a neck osteotomy with the femur on traction.  The femoral   head was removed without difficulty or complication.  Traction was let   off and retractors were placed posterior and anterior around the   acetabulum.      The labrum and foveal tissue were debrided.  I began reaming with a 46 mm   reamer and reamed up to 53 mm reamer with good bony bed preparation and a 54 mm  cup was chosen.  The final 54 mm Pinnacle cup was then impacted under fluoroscopy to confirm the depth of penetration and orientation with respect to   Abduction and forward flexion.  A screw was placed into the ilium followed by the hole eliminator.  The final   36+4  neutral Altrex liner was impacted with good visualized rim fit.  The cup was positioned anatomically within the acetabular portion of the pelvis.      At this point, the femur was rolled to 100 degrees.  Further capsule was   released off the inferior aspect of the femoral neck.  I then   released the superior capsule proximally.  With the leg in a neutral position the hook was placed laterally   along the femur under the vastus lateralis origin and elevated manually and then held in position using the hook attachment on the bed.  The leg was then extended and adducted with the leg rolled to 100   degrees of external rotation.  Retractors were placed along the medial calcar and posteriorly over the greater trochanter.  Once the proximal femur was fully   exposed, I used a box osteotome to set orientation.  I then began   broaching with the starting chili pepper broach and passed this by hand and then broached up to 6.  With the 6 broach in place I chose a high offset neck and did several trial reductions.  The offset was appropriate, leg lengths   appeared to be equal best matched with the +5 head ball trial confirmed radiographically.   Given these findings, I went ahead and dislocated the hip, repositioned all   retractors and positioned the right hip in the extended and abducted position.  The final 6 Hi Actis stem was   chosen and it was impacted down to the level of neck cut.  Based on this   and the trial reductions, a final 36+5 delta ceramic ball was chosen and   impacted onto a clean and dry trunnion, and the hip was reduced.  The   hip had been irrigated throughout the case again at this point.  I did   reapproximate the superior capsular leaflet to the anterior leaflet   using #1 Vicryl.  The fascia of the   tensor fascia lata muscle was then reapproximated using #1 Vicryl and #0 Stratafix sutures.  The   remaining wound was closed with 2-0 Vicryl and running 4-0 Monocryl.   The hip  was cleaned, dried, and dressed sterilely using Dermabond and   Aquacel dressing.  The patient was then brought   to  recovery room in stable condition tolerating the procedure well.    Dennie Bible, PA-C was present for the entirety of the case involved from   preoperative positioning, perioperative retractor management, general   facilitation of the case, as well as primary wound closure as assistant.            Madlyn Frankel Charlann Boxer, M.D.        10/09/2020 12:45 PM

## 2020-10-09 NOTE — Anesthesia Postprocedure Evaluation (Signed)
Anesthesia Post Note  Patient: Justin Saunders  Procedure(s) Performed: TOTAL HIP ARTHROPLASTY ANTERIOR APPROACH (Left Hip)     Patient location during evaluation: PACU Anesthesia Type: Spinal Level of consciousness: oriented and awake and alert Pain management: pain level controlled Vital Signs Assessment: post-procedure vital signs reviewed and stable Respiratory status: spontaneous breathing, respiratory function stable and nonlabored ventilation Cardiovascular status: blood pressure returned to baseline and stable Postop Assessment: no headache, no backache, no apparent nausea or vomiting, spinal receding and patient able to bend at knees Anesthetic complications: no   No complications documented.  Last Vitals:  Vitals:   10/09/20 1645 10/09/20 1700  BP: 122/79 116/67  Pulse: 76 63  Resp: 17 13  Temp:    SpO2: 100% 100%    Last Pain:  Vitals:   10/09/20 1700  TempSrc:   PainSc: 5                  Augustin Bun A.

## 2020-10-09 NOTE — Anesthesia Procedure Notes (Signed)
Spinal  Patient location during procedure: OR Reason for block: surgical anesthesia Staffing Performed: resident/CRNA  Resident/CRNA: Berl Bonfanti A, CRNA Preanesthetic Checklist Completed: patient identified, IV checked, site marked, risks and benefits discussed, surgical consent, monitors and equipment checked, pre-op evaluation and timeout performed Spinal Block Patient position: sitting Prep: DuraPrep Patient monitoring: heart rate, cardiac monitor, continuous pulse ox and blood pressure Approach: midline Location: L3-4 Injection technique: single-shot Needle Needle type: Pencan  Needle gauge: 24 G Needle length: 10 cm Assessment Sensory level: T4 Events: CSF return     

## 2020-10-10 ENCOUNTER — Encounter (HOSPITAL_COMMUNITY): Payer: Self-pay | Admitting: Orthopedic Surgery

## 2020-10-10 DIAGNOSIS — M1612 Unilateral primary osteoarthritis, left hip: Secondary | ICD-10-CM | POA: Diagnosis not present

## 2020-10-10 LAB — CBC
HCT: 31.8 % — ABNORMAL LOW (ref 39.0–52.0)
Hemoglobin: 10.7 g/dL — ABNORMAL LOW (ref 13.0–17.0)
MCH: 33.6 pg (ref 26.0–34.0)
MCHC: 33.6 g/dL (ref 30.0–36.0)
MCV: 100 fL (ref 80.0–100.0)
Platelets: 309 10*3/uL (ref 150–400)
RBC: 3.18 MIL/uL — ABNORMAL LOW (ref 4.22–5.81)
RDW: 13.7 % (ref 11.5–15.5)
WBC: 26.4 10*3/uL — ABNORMAL HIGH (ref 4.0–10.5)
nRBC: 0 % (ref 0.0–0.2)

## 2020-10-10 LAB — BASIC METABOLIC PANEL
Anion gap: 9 (ref 5–15)
BUN: 9 mg/dL (ref 8–23)
CO2: 24 mmol/L (ref 22–32)
Calcium: 9.1 mg/dL (ref 8.9–10.3)
Chloride: 103 mmol/L (ref 98–111)
Creatinine, Ser: 0.78 mg/dL (ref 0.61–1.24)
GFR, Estimated: >60 mL/min (ref >60)
Glucose, Bld: 127 mg/dL — ABNORMAL HIGH (ref 70–99)
Potassium: 3.6 mmol/L (ref 3.5–5.1)
Sodium: 136 mmol/L (ref 135–145)

## 2020-10-10 MED ORDER — ASPIRIN 81 MG PO CHEW: 81.0000 mg | CHEWABLE_TABLET | Freq: Two times a day (BID) | ORAL | 0 refills | Status: AC

## 2020-10-10 MED ORDER — TRAMADOL HCL 50 MG PO TABS
25.0000 mg | ORAL_TABLET | Freq: Four times a day (QID) | ORAL | Status: DC | PRN
  Administered 2020-10-10: 50 mg via ORAL
  Filled 2020-10-10: qty 1

## 2020-10-10 MED ORDER — METHOCARBAMOL 500 MG PO TABS: 500.0000 mg | ORAL_TABLET | Freq: Four times a day (QID) | ORAL | 0 refills | Status: DC | PRN

## 2020-10-10 MED ORDER — TRAMADOL HCL 50 MG PO TABS: 50.0000 mg | ORAL_TABLET | Freq: Four times a day (QID) | ORAL | 0 refills | Status: DC | PRN

## 2020-10-10 MED ORDER — POLYETHYLENE GLYCOL 3350 17 G PO PACK: 17.0000 g | PACK | Freq: Every day | ORAL | 0 refills | Status: AC | PRN

## 2020-10-10 MED ORDER — DOCUSATE SODIUM 100 MG PO CAPS: 100.0000 mg | ORAL_CAPSULE | Freq: Two times a day (BID) | ORAL | 0 refills | Status: DC

## 2020-10-10 MED ORDER — HYDROCODONE-ACETAMINOPHEN 5-325 MG PO TABS: 1.0000 | ORAL_TABLET | Freq: Four times a day (QID) | ORAL | Status: DC | PRN

## 2020-10-10 MED ORDER — MELOXICAM 15 MG PO TABS: 15.0000 mg | ORAL_TABLET | Freq: Every day | ORAL | Status: DC

## 2020-10-10 MED ORDER — KETOROLAC TROMETHAMINE 15 MG/ML IJ SOLN
7.5000 mg | Freq: Four times a day (QID) | INTRAMUSCULAR | Status: DC
  Administered 2020-10-10 (×2): 7.5 mg via INTRAVENOUS
  Filled 2020-10-10 (×2): qty 1

## 2020-10-10 NOTE — Progress Notes (Signed)
Physical Therapy Treatment Patient Details Name: Justin Saunders MRN: [id number] DOB: 1955/10/16 Today's Date: 10/10/2020    History of Present Illness s/p L DA THA    PT Comments    Pt continues to progress. Reviewed stair technique this pm, pt able to return demo without difficulty. Reviewed THA HEP. Pt is ready to d/c with family assist from PT standpoint.   Follow Up Recommendations  Follow surgeon's recommendation for DC plan and follow-up therapies     Equipment Recommendations  Other (comment) (delivered)    Recommendations for Other Services       Precautions / Restrictions Precautions Precautions: Fall Restrictions Weight Bearing Restrictions: No LLE Weight Bearing: Weight bearing as tolerated    Mobility  Bed Mobility Overal bed mobility: Needs Assistance Bed Mobility: Supine to Sit     Supine to sit: Min assist     General bed mobility comments: in recliner    Transfers Overall transfer level: Needs assistance Equipment used: Rolling walker (2 wheeled) Transfers: Sit to/from Stand Sit to Stand: Supervision         General transfer comment: cues for hand placement  Ambulation/Gait Ambulation/Gait assistance: Supervision;Modified independent (Device/Increase time) Gait Distance (Feet): 270 Feet Assistive device: Rolling walker (2 wheeled) Gait Pattern/deviations: Step-to pattern;Decreased stance time - left;Decreased weight shift to left     General Gait Details: cues for sequence and RW position   Stairs Stairs: Yes Stairs assistance: Min guard;Min assist Stair Management: No rails;Step to pattern;Backwards;With walker Number of Stairs: 3 General stair comments: cues for sequence and technique   Wheelchair Mobility    Modified Rankin (Stroke Patients Only)       Balance                                            Cognition Arousal/Alertness: Awake/alert Behavior During Therapy: WFL for tasks  assessed/performed Overall Cognitive Status: Within Functional Limits for tasks assessed                                        Exercises      General Comments        Pertinent Vitals/Pain Pain Assessment: 0-10 Pain Score: 3  Pain Location: L hip Pain Descriptors / Indicators: Discomfort;Sore;Tightness Pain Intervention(s): Limited activity within patient's tolerance;Monitored during session;Repositioned    Home Living Family/patient expects to be discharged to:: Private residence Living Arrangements: Spouse/significant other Available Help at Discharge: Family Type of Home: House     Home Layout: Two level;Able to live on main level with bedroom/bathroom Home Equipment: Cane - single point Additional Comments: sleeping on sofa as long as needed    Prior Function Level of Independence: Independent          PT Goals (current goals can now be found in the care plan section) Acute Rehab PT Goals Patient Stated Goal: home PT Goal Formulation: With patient Time For Goal Achievement: 10/17/20 Potential to Achieve Goals: Good Progress towards PT goals: Progressing toward goals    Frequency    7X/week      PT Plan Current plan remains appropriate    Co-evaluation              AM-PAC PT "6 Clicks" Mobility   Outcome Measure  Help needed turning from your back  to your side while in a flat bed without using bedrails?: A Little Help needed moving from lying on your back to sitting on the side of a flat bed without using bedrails?: A Little Help needed moving to and from a bed to a chair (including a wheelchair)?: A Little Help needed standing up from a chair using your arms (e.g., wheelchair or bedside chair)?: A Little Help needed to walk in hospital room?: A Little Help needed climbing 3-5 steps with a railing? : A Little 6 Click Score: 18    End of Session Equipment Utilized During Treatment: Gait belt Activity Tolerance: Patient  tolerated treatment well Patient left: with call bell/phone within reach;in chair;with chair alarm set   PT Visit Diagnosis: Other abnormalities of gait and mobility (R26.89)     Time: 9563-8756 PT Time Calculation (min) (ACUTE ONLY): 26 min  Charges:  $Gait Training: 23-37 mins                     Delice Bison, PT  Acute Rehab Dept ([facility]) [phone] Pager [phone]  10/10/2020    [facility] 10/10/2020, 2:03 PM

## 2020-10-10 NOTE — Progress Notes (Signed)
   Subjective: 1 Day Post-Op Procedure(s) (LRB): TOTAL HIP ARTHROPLASTY ANTERIOR APPROACH (Left) Patient reports pain as 7 on 0-10 scale and severe.   Patient seen in rounds by Dr. Charlann Boxer. Patient is resting in bed on exam. No acute events overnight. Foley catheter removed, positive flatus.  We will start therapy today.   Objective: Vital signs in last 24 hours: Temp:  [97 F (36.1 C)-98.2 F (36.8 C)] 98.1 F (36.7 C) (03/23 0524) Pulse Rate:  [63-101] 101 (03/23 0524) Resp:  [10-19] 17 (03/23 0524) BP: (107-139)/(65-87) 135/84 (03/23 0524) SpO2:  [96 %-100 %] 96 % (03/23 0524) Weight:  [52 kg] 52 kg (03/23 0815)  Intake/Output from previous day:  Intake/Output Summary (Last 24 hours) at 10/10/2020 0819 Last data filed at 10/10/2020 1062 Gross per 24 hour  Intake 2101.84 ml  Output 1850 ml  Net 251.84 ml     Intake/Output this shift: No intake/output data recorded.  Labs: No results for input(s): HGB in the last 72 hours. No results for input(s): WBC, RBC, HCT, PLT in the last 72 hours. No results for input(s): NA, K, CL, CO2, BUN, CREATININE, GLUCOSE, CALCIUM in the last 72 hours. No results for input(s): LABPT, INR in the last 72 hours.  Exam: General - Patient is Alert and Oriented Extremity - Neurologically intact Sensation intact distally Intact pulses distally Dorsiflexion/Plantar flexion intact Dressing - dressing C/D/I Motor Function - intact, moving foot and toes well on exam.   Past Medical History:  Diagnosis Date  . Allergic rhinitis   . Back pain   . Coronary artery disease    4 stents  . DDD (degenerative disc disease), cervical    thoracic and lumbar  . DDD (degenerative disc disease), lumbar   . Depression   . Headache   . Heart disease   . Hepatitis    C treated 15 years ago   . Hyperlipidemia   . Hypertension   . Hypothyroidism   . Panic attacks   . Pre-diabetes   . Sinusitis     Assessment/Plan: 1 Day Post-Op Procedure(s)  (LRB): TOTAL HIP ARTHROPLASTY ANTERIOR APPROACH (Left) Active Problems:   S/P total left hip arthroplasty   S/P total hip arthroplasty  Estimated body mass index is 19.68 kg/m as calculated from the following:   Height as of this encounter: 5\' 4"  (1.626 m).   Weight as of this encounter: 52 kg. Advance diet Up with therapy D/C IV fluids  DVT Prophylaxis - Aspirin Weight bearing as tolerated.  Plan is to go Home after hospital stay with HEP. Patient indicated at his pre-op visit that he has had nausea/vomiting with opioids in the past, and wished to try to only use Tramadol. He reports 7/10 pain this morning, and Dr. discussed trying Hydrocodone and Toradol to address his pain. After pain meds, he will work on moving with PT. If he progresses to meet his goals, we will plan for discharge today. Follow up in the office in 2 weeks. All questions welcomed and addressed.   Charlann Boxer, PA-C Orthopedic Surgery [phone] 10/10/2020, 8:19 AM

## 2020-10-10 NOTE — TOC Transition Note (Signed)
Transition of Care [facility]) - CM/SW Discharge Note  Patient Details  Name: SYRE KNERR MRN: [id number] Date of Birth: 1955-08-22  [facility]) CM/SW Contact:  Sherie Don, LCSW Phone Number: 10/10/2020, 10:32 AM  Clinical Narrative: Patient to discharge today. CSW met with patient to confirm discharge plan. Per patient, he will discharge home with an HEP and will need a RW and 3N1. [employer] to deliver DME to room. No additional needs at this time. TOC signing off.  Final next level of care: Home/Self Care Barriers to Discharge: No Barriers Identified  Patient Goals and CMS Choice Patient states their goals for this hospitalization and ongoing recovery are:: Discharge home CMS Medicare.gov Compare Post Acute Care list provided to:: Patient Choice offered to / list presented to : Patient  Discharge Plan and Services       DME Arranged: 3-N-1,Walker rolling DME Agency: [employer] Date DME Agency Contacted: 10/10/20 Representative spoke with at DME Agency: Normal prior to surgery Ovid Curd)  Readmission Risk Interventions No flowsheet data found.

## 2020-10-10 NOTE — Evaluation (Signed)
Physical Therapy Evaluation Patient Details Name: Justin Saunders MRN: [id number] DOB: 1956/01/10 Today's Date: 10/10/2020   History of Present Illness  s/p L DA THA  Clinical Impression  Pt is s/p THA resulting in the deficits listed below (see PT Problem List).  Pt will likely be ready for d/c after second PT session and pain remains controlled  Pt will benefit from skilled PT to increase their independence and safety with mobility to allow discharge to the venue listed below.      Follow Up Recommendations Follow surgeon's recommendation for DC plan and follow-up therapies    Equipment Recommendations  Other (comment) (delivered)    Recommendations for Other Services       Precautions / Restrictions Precautions Precautions: Fall Restrictions Weight Bearing Restrictions: No LLE Weight Bearing: Weight bearing as tolerated      Mobility  Bed Mobility Overal bed mobility: Needs Assistance Bed Mobility: Supine to Sit     Supine to sit: Min assist     General bed mobility comments: assist with LLE    Transfers Overall transfer level: Needs assistance Equipment used: Rolling walker (2 wheeled) Transfers: Sit to/from Stand Sit to Stand: Min guard         General transfer comment: cues for hand placement  Ambulation/Gait Ambulation/Gait assistance: Min assist Gait Distance (Feet): 90 Feet Assistive device: Rolling walker (2 wheeled) Gait Pattern/deviations: Step-to pattern;Decreased stance time - left;Decreased weight shift to left     General Gait Details: cues for sequence and RW position  Stairs            Wheelchair Mobility    Modified Rankin (Stroke Patients Only)       Balance                                             Pertinent Vitals/Pain Pain Assessment: 0-10 Pain Score: 3  Pain Location: L hip Pain Descriptors / Indicators: Grimacing;Sore Pain Intervention(s): Limited activity within patient's  tolerance;Monitored during session;Premedicated before session;Repositioned    Home Living Family/patient expects to be discharged to:: Private residence Living Arrangements: Spouse/significant other Available Help at Discharge: Family Type of Home: House       Home Layout: Two level;Able to live on main level with bedroom/bathroom Home Equipment: Cane - single point Additional Comments: sleeping on sofa as long as needed    Prior Function Level of Independence: Independent               Hand Dominance        Extremity/Trunk Assessment   Upper Extremity Assessment Upper Extremity Assessment: Overall WFL for tasks assessed    Lower Extremity Assessment Lower Extremity Assessment: LLE deficits/detail LLE Deficits / Details: ankle WFL, hip ROM limited by pain. knee flexion/extension grossly WFL, strength NT d/t hip pain       Communication   Communication: No difficulties  Cognition Arousal/Alertness: Awake/alert Behavior During Therapy: WFL for tasks assessed/performed Overall Cognitive Status: Within Functional Limits for tasks assessed                                        General Comments      Exercises     Assessment/Plan    PT Assessment Patient needs continued PT services  PT Problem List Decreased  strength;Decreased range of motion;Decreased mobility;Decreased activity tolerance;Decreased knowledge of use of DME;Decreased balance       PT Treatment Interventions DME instruction;Therapeutic activities;Gait training;Functional mobility training;Therapeutic exercise;Patient/family education;Stair training    PT Goals (Current goals can be found in the Care Plan section)  Acute Rehab PT Goals Patient Stated Goal: home PT Goal Formulation: With patient Time For Goal Achievement: 10/17/20 Potential to Achieve Goals: Good    Frequency 7X/week   Barriers to discharge        Co-evaluation               AM-PAC PT "6  Clicks" Mobility  Outcome Measure Help needed turning from your back to your side while in a flat bed without using bedrails?: A Little Help needed moving from lying on your back to sitting on the side of a flat bed without using bedrails?: A Little Help needed moving to and from a bed to a chair (including a wheelchair)?: A Little Help needed standing up from a chair using your arms (e.g., wheelchair or bedside chair)?: A Little Help needed to walk in hospital room?: A Little Help needed climbing 3-5 steps with a railing? : A Little 6 Click Score: 18    End of Session Equipment Utilized During Treatment: Gait belt Activity Tolerance: Patient tolerated treatment well Patient left: with call bell/phone within reach;in chair;with chair alarm set   PT Visit Diagnosis: Other abnormalities of gait and mobility (R26.89)    Time: 1030-1054 PT Time Calculation (min) (ACUTE ONLY): 24 min   Charges:   PT Evaluation $PT Eval Low Complexity: 1 Low PT Treatments $Gait Training: 8-22 mins        Delice Bison, PT  Acute Rehab Dept ([facility]) [phone] Pager [phone]  10/10/2020   [facility] 10/10/2020, 11:04 AM

## 2020-10-10 NOTE — Care Management Obs Status (Signed)
MEDICARE OBSERVATION STATUS NOTIFICATION   Patient Details  Name: Justin Saunders MRN: [id number] Date of Birth: 07-Mar-1956   Medicare Observation Status Notification Given:  Yes    Ewing Schlein, LCSW 10/10/2020, 3:43 PM

## 2020-10-15 NOTE — Discharge Summary (Signed)
Physician Discharge Summary   Patient ID: Justin Saunders MRN: [id number] DOB/AGE: 66-Dec-1957 66 y.o.  Admit date: 10/09/2020 Discharge date: 10/10/2020  Primary Diagnosis: Left hip osteoarthritis  Admission Diagnoses:  Past Medical History:  Diagnosis Date  . Allergic rhinitis   . Back pain   . Coronary artery disease    4 stents  . DDD (degenerative disc disease), cervical    thoracic and lumbar  . DDD (degenerative disc disease), lumbar   . Depression   . Headache   . Heart disease   . Hepatitis    C treated 15 years ago   . Hyperlipidemia   . Hypertension   . Hypothyroidism   . Panic attacks   . Pre-diabetes   . Sinusitis    Discharge Diagnoses:   Active Problems:   S/P total left hip arthroplasty   S/P total hip arthroplasty  Estimated body mass index is 19.68 kg/m as calculated from the following:   Height as of this encounter: 5\' 4"  (1.626 m).   Weight as of this encounter: 52 kg.  Procedure:  Procedure(s) (LRB): TOTAL HIP ARTHROPLASTY ANTERIOR APPROACH (Left)   Consults: None  HPI: Justin Saunders is a 65 y.o. male who had   presented to office for evaluation of left hip pain.  Radiographs revealed   progressive degenerative changes with bone-on-bone   articulation of the  hip joint, including subchondral cystic changes and osteophytes.  The patient had painful limited range of   motion significantly affecting their overall quality of life and function.  The patient was failing to   respond to conservative measures including medications and/or injections and activity modification and at this point was ready   to proceed with more definitive measures.  Consent was obtained for   benefit of pain relief.  Specific risks of infection, DVT, component   failure, dislocation, neurovascular injury, and need for revision surgery were reviewed in the office as well discussion of   the anterior versus posterior approach were reviewed.  Laboratory Data: Admission on  10/09/2020, Discharged on 10/10/2020  Component Date Value Ref Range Status  . ABO/RH(D) 10/09/2020    Final                   Value:A POS Performed at [facility], [address] [place], [place]   . WBC 10/10/2020 26.4* 4.0 - 10.5 K/uL Final  . RBC 10/10/2020 3.18* 4.22 - 5.81 MIL/uL Final  . Hemoglobin 10/10/2020 10.7* 13.0 - 17.0 g/dL Final  . HCT 10/12/2020 31.8* 39.0 - 52.0 % Final  . MCV 10/10/2020 100.0  80.0 - 100.0 fL Final  . MCH 10/10/2020 33.6  26.0 - 34.0 pg Final  . MCHC 10/10/2020 33.6  30.0 - 36.0 g/dL Final  . RDW 10/12/2020 13.7  11.5 - 15.5 % Final  . Platelets 10/10/2020 309  150 - 400 K/uL Final  . nRBC 10/10/2020 0.0  0.0 - 0.2 % Final   Performed at [facility], [address], [place], [place]  . Sodium 10/10/2020 136  135 - 145 mmol/L Final  . Potassium 10/10/2020 3.6  3.5 - 5.1 mmol/L Final  . Chloride 10/10/2020 103  98 - 111 mmol/L Final  . CO2 10/10/2020 24  22 - 32 mmol/L Final  . Glucose, Bld 10/10/2020 127* 70 - 99 mg/dL Final   Glucose reference range applies only to samples taken after fasting for at least 8 hours.  10/12/2020  BUN 10/10/2020 9  8 - 23 mg/dL Final  . Creatinine, Ser 10/10/2020 0.78  0.61 - 1.24 mg/dL Final  . Calcium 16/10/960403/23/2022 9.1  8.9 - 10.3 mg/dL Final  . GFR, Estimated 10/10/2020 >60  >60 mL/min Final   Comment: (NOTE) Calculated using the CKD-EPI Creatinine Equation (2021)   . Anion gap 10/10/2020 9  5 - 15 Final   Performed at [facility], [address] [place], [place] [address]  Hospital Outpatient Visit on 10/05/2020  Component Date Value Ref Range Status  . SARS Coronavirus 2 10/05/2020 NEGATIVE  NEGATIVE Final   Comment: (NOTE) SARS-CoV-2 target nucleic acids are NOT DETECTED.  The SARS-CoV-2 RNA is generally detectable in upper and lower respiratory specimens during the acute phase of infection. Negative results do not preclude SARS-CoV-2  infection, do not rule out co-infections with other pathogens, and should not be used as the sole basis for treatment or other patient management decisions. Negative results must be combined with clinical observations, patient history, and epidemiological information. The expected result is Negative.  Fact Sheet for Patients: HairSlick.nohttps://www.fda.gov/media/138098/download  Fact Sheet for Healthcare Providers: quierodirigir.comhttps://www.fda.gov/media/138095/download  This test is not yet approved or cleared by the [place] FDA and  has been authorized for detection and/or diagnosis of SARS-CoV-2 by FDA under an Emergency Use Authorization (EUA). This EUA will remain  in effect (meaning this test can be used) for the duration of the COVID-19 declaration under Se                          ction 564(b)(1) of the Act, 21 U.S.C. section 360bbb-3(b)(1), unless the authorization is terminated or revoked sooner.  Performed at [facility], [address] [place], [place] [address]   Hospital Outpatient Visit on 09/28/2020  Component Date Value Ref Range Status  . Sodium 09/28/2020 143  135 - 145 mmol/L Final  . Potassium 09/28/2020 4.1  3.5 - 5.1 mmol/L Final  . Chloride 09/28/2020 108  98 - 111 mmol/L Final  . CO2 09/28/2020 25  22 - 32 mmol/L Final  . Glucose, Bld 09/28/2020 97  70 - 99 mg/dL Final   Glucose reference range applies only to samples taken after fasting for at least 8 hours.  . BUN 09/28/2020 15  8 - 23 mg/dL Final  . Creatinine, Ser 09/28/2020 0.82  0.61 - 1.24 mg/dL Final  . Calcium 78/29/562103/05/2021 9.3  8.9 - 10.3 mg/dL Final  . GFR, Estimated 09/28/2020 >60  >60 mL/min Final   Comment: (NOTE) Calculated using the CKD-EPI Creatinine Equation (2021)   . Anion gap 09/28/2020 10  5 - 15 Final   Performed at [facility], [address] [place], [place] [address]  . WBC 09/28/2020 14.7* 4.0 - 10.5 K/uL Final  . RBC 09/28/2020 3.67* 4.22 - 5.81 MIL/uL Final  .  Hemoglobin 09/28/2020 12.7* 13.0 - 17.0 g/dL Final  . HCT 78/46/962903/05/2021 37.7* 39.0 - 52.0 % Final  . MCV 09/28/2020 102.7* 80.0 - 100.0 fL Final  . MCH 09/28/2020 34.6* 26.0 - 34.0 pg Final  . MCHC 09/28/2020 33.7  30.0 - 36.0 g/dL Final  . RDW 52/84/132403/05/2021 14.3  11.5 - 15.5 % Final  . Platelets 09/28/2020 389  150 - 400 K/uL Final  . nRBC 09/28/2020 0.0  0.0 - 0.2 % Final   Performed at [facility], [address] [place], [place] [address]  . ABO/RH(D) 09/28/2020  A POS   Final  . Antibody Screen 09/28/2020 NEG   Final  . Sample Expiration 09/28/2020 10/12/2020,2359   Final  . Extend sample reason 09/28/2020    Final                   Value:NO TRANSFUSIONS OR PREGNANCY IN THE PAST 3 MONTHS Performed at [facility], [address] [place], [place] [address]   . MRSA, PCR 09/28/2020 NEGATIVE  NEGATIVE Final  . Staphylococcus aureus 09/28/2020 NEGATIVE  NEGATIVE Final   Comment: (NOTE) The Xpert SA Assay (FDA approved for NASAL specimens in patients 42 years of age and older), is one component of a comprehensive surveillance program. It is not intended to diagnose infection nor to guide or monitor treatment. Performed at [facility], [address] [place], [place] [address]   . Hgb A1c MFr Bld 09/28/2020 5.5  4.8 - 5.6 % Final   Comment: (NOTE) Pre diabetes:          5.7%-6.4%  Diabetes:              >6.4%  Glycemic control for   <7.0% adults with diabetes   . Mean Plasma Glucose 09/28/2020 111.15  mg/dL Final   Performed at [facility], [address], [place], [place] [address]     X-Rays:DG C-Arm 1-60 Min-No Report  Result Date: 10/09/2020 Fluoroscopy was utilized by the requesting physician.  No radiographic interpretation.   DG HIP OPERATIVE UNILAT W OR W/O PELVIS LEFT  Result Date: 10/09/2020 CLINICAL DATA:  Left hip replacement EXAM: OPERATIVE LEFT HIP (WITH PELVIS IF PERFORMED) 1 VIEW TECHNIQUE:  Fluoroscopic spot image(s) were submitted for interpretation post-operatively. COMPARISON:  None. FINDINGS: Two frontal views of the left hip demonstrating total hip arthroplasty with single acetabular screw. No evidence of complication. IMPRESSION: Left total hip arthroplasty. Electronically Signed   By: Guadlupe Spanish M.D.   On: 10/09/2020 18:01    EKG: Orders placed or performed during the hospital encounter of 09/28/20  . EKG 12 lead per protocol  . EKG 12 lead per protocol     Hospital Course: Justin Saunders is a 65 y.o. who was admitted to [facility]. They were brought to the operating room on 10/09/2020 and underwent Procedure(s): TOTAL HIP ARTHROPLASTY ANTERIOR APPROACH.  Patient tolerated the procedure well and was later transferred to the recovery room and then to the orthopaedic floor for postoperative care. They were given PO and IV analgesics for pain control following their surgery. They were given 24 hours of postoperative antibiotics of  Anti-infectives (From admission, onward)   Start     Dose/Rate Route Frequency Ordered Stop   10/09/20 2000  ceFAZolin (ANCEF) IVPB 2g/100 mL premix        2 g 200 mL/hr over 30 Minutes Intravenous Every 6 hours 10/09/20 1912 10/10/20 0221   10/09/20 1145  ceFAZolin (ANCEF) IVPB 2g/100 mL premix        2 g 200 mL/hr over 30 Minutes Intravenous On call to O.R. 10/09/20 1136 10/09/20 1406     and started on DVT prophylaxis in the form of Aspirin.   PT and OT were ordered for total joint protocol. Discharge planning consulted to help with postop disposition and equipment needs.  Patient had a fair night on the evening of surgery. He had difficulty with pain control, which improved when we altered his medication. They started to get up OOB with therapy on  POD #1. Pt was seen during rounds and was ready to go home pending progress with therapy.He worked with therapy on POD #1 and was meeting his goals. Pt was discharged to home later that day  in stable condition.  Diet: Regular diet Activity: WBAT Follow-up: in 2 weeks Disposition: Home Discharged Condition: good   Discharge Instructions    Call MD / Call 911   Complete by: As directed    If you experience chest pain or shortness of breath, CALL 911 and be transported to the hospital emergency room.  If you develope a fever above 101 F, pus (white drainage) or increased drainage or redness at the wound, or calf pain, call your surgeon's office.   Change dressing   Complete by: As directed    Maintain surgical dressing until follow up in the clinic. If the edges start to pull up, may reinforce with tape. If the dressing is no longer working, may remove and cover with gauze and tape, but must keep the area dry and clean.  Call with any questions or concerns.   Constipation Prevention   Complete by: As directed    Drink plenty of fluids.  Prune juice may be helpful.  You may use a stool softener, such as Colace (over the counter) 100 mg twice a day.  Use MiraLax (over the counter) for constipation as needed.   Diet - low sodium heart healthy   Complete by: As directed    Discharge instructions   Complete by: As directed    Maintain surgical dressing until follow up in the clinic. If the edges start to pull up, may reinforce with tape. If the dressing is no longer working, may remove and cover with gauze and tape, but must keep the area dry and clean.  Follow up in 2 weeks at [facility]. Call with any questions or concerns.   Increase activity slowly as tolerated   Complete by: As directed    Weight bearing as tolerated with assist device (walker, cane, etc) as directed, use it as long as suggested by your surgeon or therapist, typically at least 4-6 weeks.   TED hose   Complete by: As directed    Use stockings (TED hose) for 2 weeks on both leg(s).  You may remove them at night for sleeping.     Allergies as of 10/10/2020      Reactions   Tylenol [acetaminophen]    Causes  headaches   Codeine Nausea And Vomiting   Hydromorphone Nausea And Vomiting      Medication List    STOP taking these medications   acetaminophen 500 MG tablet Commonly known as: TYLENOL     TAKE these medications   ALPRAZolam 1 MG tablet Commonly known as: XANAX Take 1 mg by mouth in the morning, at noon, in the evening, and at bedtime.   amLODipine 10 MG tablet Commonly known as: NORVASC Take 10 mg by mouth daily.   aspirin 81 MG chewable tablet Chew 1 tablet (81 mg total) by mouth 2 (two) times daily for 28 days.   aspirin-acetaminophen-caffeine 250-250-65 MG tablet Commonly known as: EXCEDRIN MIGRAINE Take 1 tablet by mouth every 6 (six) hours as needed for headache.   Goodys Extra Strength [id number] MG Pack Generic drug: Aspirin-Acetaminophen-Caffeine Take 1 packet by mouth daily as needed (headaches).   docusate sodium 100 MG capsule Commonly known as: COLACE Take 1 capsule (100 mg total) by mouth 2 (two) times daily.   ipratropium 0.06 %  nasal spray Commonly known as: ATROVENT Place 2 sprays into both nostrils 2 (two) times daily as needed for rhinitis.   levothyroxine 25 MCG tablet Commonly known as: SYNTHROID Take 25 mcg by mouth daily before breakfast.   lisinopril 20 MG tablet Commonly known as: ZESTRIL Take 40 mg by mouth daily.   meloxicam 15 MG tablet Commonly known as: MOBIC Take 15 mg by mouth daily.   methocarbamol 500 MG tablet Commonly known as: ROBAXIN Take 1 tablet (500 mg total) by mouth every 6 (six) hours as needed for muscle spasms.   metoprolol succinate 100 MG 24 hr tablet Commonly known as: TOPROL-XL Take 50 mg by mouth daily.   montelukast 10 MG tablet Commonly known as: SINGULAIR Take 10 mg by mouth every morning.   omeprazole 40 MG capsule Commonly known as: PRILOSEC Take 40 mg by mouth daily.   polyethylene glycol 17 g packet Commonly known as: MIRALAX / GLYCOLAX Take 17 g by mouth daily as needed for mild  constipation.   traMADol 50 MG tablet Commonly known as: ULTRAM Take 1-2 tablets (50-100 mg total) by mouth every 6 (six) hours as needed for moderate pain.            Discharge Care Instructions  (From admission, onward)         Start     Ordered   10/10/20 0000  Change dressing       Comments: Maintain surgical dressing until follow up in the clinic. If the edges start to pull up, may reinforce with tape. If the dressing is no longer working, may remove and cover with gauze and tape, but must keep the area dry and clean.  Call with any questions or concerns.   10/10/20 1554          Follow-up Information    Durene Romans, MD. Schedule an appointment as soon as possible for a visit in 2 weeks.   Specialty: Orthopedic Surgery Contact information: [address] [place] [place] [address] [phone]               Signed: Dennie Bible, PA-C Orthopedic Surgery 10/15/2020, 8:10 AM

## 2021-08-16 ENCOUNTER — Telehealth: Payer: Self-pay | Admitting: Physical Medicine and Rehabilitation

## 2021-08-16 NOTE — Telephone Encounter (Signed)
Patient called. He would like an appointment with Dr. Alvester Morin. His call back number is [phone]

## 2021-09-03 ENCOUNTER — Ambulatory Visit: Payer: Self-pay

## 2021-09-03 ENCOUNTER — Ambulatory Visit: Payer: Medicare Other | Admitting: Physical Medicine and Rehabilitation

## 2021-09-03 ENCOUNTER — Other Ambulatory Visit: Payer: Self-pay

## 2021-09-03 ENCOUNTER — Encounter: Payer: Self-pay | Admitting: Physical Medicine and Rehabilitation

## 2021-09-03 VITALS — BP 169/76 | HR 74

## 2021-09-03 DIAGNOSIS — M5136 Other intervertebral disc degeneration, lumbar region: Secondary | ICD-10-CM

## 2021-09-03 DIAGNOSIS — M5416 Radiculopathy, lumbar region: Secondary | ICD-10-CM | POA: Diagnosis not present

## 2021-09-03 DIAGNOSIS — G8929 Other chronic pain: Secondary | ICD-10-CM

## 2021-09-03 DIAGNOSIS — M2560 Stiffness of unspecified joint, not elsewhere classified: Secondary | ICD-10-CM

## 2021-09-03 DIAGNOSIS — G894 Chronic pain syndrome: Secondary | ICD-10-CM

## 2021-09-03 DIAGNOSIS — M47816 Spondylosis without myelopathy or radiculopathy, lumbar region: Secondary | ICD-10-CM

## 2021-09-03 MED ORDER — METHYLPREDNISOLONE ACETATE 80 MG/ML IJ SUSP
80.0000 mg | Freq: Once | INTRAMUSCULAR | Status: AC
  Administered 2021-09-03: 80 mg

## 2021-09-03 NOTE — Progress Notes (Signed)
Consider repeat RFA and labs for Rheum. Pt state lower back pain . Pt state walking and standing makes the pain worse. Pt state he takes pain meds to help ease his pain.  Numeric Pain Rating Scale and Functional Assessment Average Pain 8   In the last MONTH (on 0-10 scale) has pain interfered with the following?  1. General activity like being  able to carry out your everyday physical activities such as walking, climbing stairs, carrying groceries, or moving a chair?  Rating(10)   +Driver, -BT, -Dye Allergies.

## 2021-09-03 NOTE — Patient Instructions (Signed)

## 2021-09-07 ENCOUNTER — Encounter: Payer: Self-pay | Admitting: Physical Medicine and Rehabilitation

## 2021-09-07 NOTE — Progress Notes (Signed)
[name] - 66 y.o. male MRN [id number]  Date of birth: 02-17-1956  Office Visit Note: Visit Date: 09/03/2021 PCP: Leola Brazil, DO Referred by: Leola Brazil, DO  Subjective: Chief Complaint  Patient presents with   Lower Back - Pain   HPI: Justin Saunders is a 66 y.o. male who comes in today for evaluation and management of several pain complaints including chronic severe low back pain which is midline some referral into the right hip but also generalized all over body pain and stiffness.  Patient is well-known to [place] he has done well with prior radiofrequency ablation of the upper lumbar spine but over the years has had continued degenerative changes of the lumbar spine throughout.  Has done well with intermittent epidural injection.  Does not tolerate pain medications very well.  Has a fairly active job doing IT trainer.  He is getting ready to retire soon.  Since have seen him last he has had total left hip arthroplasty by Dr.Olin at [facility].  He is also been followed in the past by Dr. Doneen Poisson in our office.  He continues to have morning stiffness that requires more than an hour to get going and get out of bed and get loosened up to move.  He is very slow to rise from seated position.  He has fairly recent imaging of the spine.  I did review lab work and he has not really had any rheumatologic type screening in the past and no diagnosis of rheumatologic disease.  He has had no rash or focal weakness or generalized weakness.  He has a history of hepatitis which was treated and he has good follow-up lab work.  He has had no specific new trauma.     Review of Systems  Musculoskeletal:  Positive for back pain and joint pain.  All other systems reviewed and are negative. Otherwise per HPI.  Assessment & Plan: Visit Diagnoses:    ICD-10-CM   1. Lumbar radiculopathy  M54.16 XR C-ARM NO REPORT    Epidural Steroid injection    methylPREDNISolone acetate  (DEPO-MEDROL) injection 80 mg    2. Chronic bilateral low back pain with right-sided sciatica  M54.41    G89.29     3. Spondylosis without myelopathy or radiculopathy, lumbar region  M47.816     4. Other intervertebral disc degeneration, lumbar region  M51.36     5. Chronic pain syndrome  G89.4     6. Chronic midline thoracic back pain  M54.6    G89.29     7. Stiffness of vertebral column  M25.60        Plan: Findings:  1.  Chronic worsening severe mostly axial low back pain with some referral pattern in the right hip with prior epidural injection relief the last time we saw him.  He is also had a history of radiofrequency ablation of the upper lumbar spine including the L2-3 and L3-4 regions.  He does have facet arthropathy worsening throughout the course with imaging.  He has pain consistent with both radicular type pain but also facet mediated axial pain.  Also has chronic pain syndrome in general of the lower back with mechanical complaints.  Today we will complete right L4 transforaminal epidural steroid injection.  Consider repeat radiofrequency ablation.  2.  Generalized allover body pain with significant stiffness with morning stiffness more than 30 minutes.  No prior rheumatologic work-up for him.  Would consider blood draw with a rheumatologic screen  in the future.   Meds & Orders:  Meds ordered this encounter  Medications   methylPREDNISolone acetate (DEPO-MEDROL) injection 80 mg    Orders Placed This Encounter  Procedures   XR C-ARM NO REPORT   Epidural Steroid injection    Follow-up: Return if symptoms worsen or fail to improve.   Procedures: No procedures performed  Lumbosacral Transforaminal Epidural Steroid Injection - Sub-Pedicular Approach with Fluoroscopic Guidance  Patient: Justin Saunders      Date of Birth: 02-Apr-1956 MRN: [id number] PCP: Leola Brazil, DO      Visit Date: 09/03/2021   Universal Protocol:    Date/Time: 09/03/2021  Consent Given  By: the patient  Position: PRONE  Additional Comments: Vital signs were monitored before and after the procedure. Patient was prepped and draped in the usual sterile fashion. The correct patient, procedure, and site was verified.   Injection Procedure Details:   Procedure diagnoses: Lumbar radiculopathy [M54.16]    Meds Administered:  Meds ordered this encounter  Medications   methylPREDNISolone acetate (DEPO-MEDROL) injection 80 mg    Laterality: Right  Location/Site: L4  Needle:5.0 in., 22 ga.  Short bevel or Quincke spinal needle  Needle Placement: Transforaminal  Findings:    -Comments: Excellent flow of contrast along the nerve, nerve root and into the epidural space.  Procedure Details: After squaring off the end-plates to get a true AP view, the C-arm was positioned so that an oblique view of the foramen as noted above was visualized. The target area is just inferior to the "nose of the scotty dog" or sub pedicular. The soft tissues overlying this structure were infiltrated with 2-3 ml. of 1% Lidocaine without Epinephrine.  The spinal needle was inserted toward the target using a "trajectory" view along the fluoroscope beam.  Under AP and lateral visualization, the needle was advanced so it did not puncture dura and was located close the 6 O'Clock position of the pedical in AP tracterory. Biplanar projections were used to confirm position. Aspiration was confirmed to be negative for CSF and/or blood. A 1-2 ml. volume of Isovue-250 was injected and flow of contrast was noted at each level. Radiographs were obtained for documentation purposes.   After attaining the desired flow of contrast documented above, a 0.5 to 1.0 ml test dose of 0.25% Marcaine was injected into each respective transforaminal space.  The patient was observed for 90 seconds post injection.  After no sensory deficits were reported, and normal lower extremity motor function was noted,   the above  injectate was administered so that equal amounts of the injectate were placed at each foramen (level) into the transforaminal epidural space.   Additional Comments:  The patient tolerated the procedure well Dressing: 2 x 2 sterile gauze and Band-Aid    Post-procedure details: Patient was observed during the procedure. Post-procedure instructions were reviewed.  Patient left the clinic in stable condition.     Clinical History: MRI LUMBAR SPINE WITHOUT CONTRAST   TECHNIQUE: Multiplanar, multisequence MR imaging of the lumbar spine was performed. No intravenous contrast was administered.   COMPARISON:  07/22/2016   FINDINGS: Segmentation: For consistency with the prior MRI report, the lowest fully formed intervertebral disc space is designated L5-S1. With this numbering, there are small ribs at L1.   Alignment: Slight chronic reversal of the normal lumbar lordosis. No listhesis.   Vertebrae: No fracture or suspicious osseous lesion. Predominantly chronic degenerative endplate changes at L2-3.   Conus medullaris and cauda equina: Conus extends  to the L2 level. Conus and cauda equina appear normal.   Paraspinal and other soft tissues: Unremarkable.   Disc levels:   Disc desiccation throughout the lumbar spine with chronic severe disc space narrowing at L2-3 and mild narrowing at the other levels.   T12-L1 and L1-2: Negative.   L2-3: Circumferential disc osteophyte complex and severe disc space height loss result in mild bilateral lateral recess stenosis and mild right and mild-to-moderate left neural foraminal stenosis without significant generalized spinal stenosis, unchanged.   L3-4: Circumferential disc bulging eccentric to the left results in mild left greater than right lateral recess stenosis and mild right and moderate left neural foraminal stenosis without significant generalized spinal stenosis, unchanged.   L4-5: Circumferential disc bulging and mild  facet hypertrophy result in moderate bilateral lateral recess stenosis and mild-to-moderate bilateral neural foraminal stenosis without significant generalized spinal stenosis, unchanged.   L5-S1: Circumferential disc bulging and mild facet hypertrophy result in mild-to-moderate bilateral lateral recess stenosis and mild-to-moderate right and moderate left neural foraminal stenosis without spinal stenosis, unchanged.   IMPRESSION: Unchanged lumbar disc and facet degeneration resulting in mild-to-moderate lateral recess and neural foraminal stenosis as above.     Electronically Signed   By: Sebastian AcheAllen  Grady M.D.   On: 06/16/2020 16:24  Cervical spine MRI10/15/2016  IMPRESSION: Severe right foraminal narrowing at C5-6 due to uncovertebral spurring. There is mild deformity of the ventral right hemicord at this level due to a disc osteophyte complex to the right.   Severe right and moderate to moderately severe left foraminal narrowing C6-7 due to uncovertebral disease.   He reports that he has been smoking cigarettes. He has a 10.50 pack-year smoking history. He has never used smokeless tobacco.  Recent Labs    09/28/20 1510  HGBA1C 5.5    Objective:  VS:  HT:     WT:    BMI:      BP: (!) 169/76   HR:74bpm   TEMP: ( )   RESP:  Physical Exam Vitals and nursing note reviewed.  Constitutional:      General: He is not in acute distress.    Appearance: Normal appearance. He is not ill-appearing.  HENT:     Head: Normocephalic and atraumatic.     Right Ear: External ear normal.     Left Ear: External ear normal.     Nose: No congestion.  Eyes:     Extraocular Movements: Extraocular movements intact.  Cardiovascular:     Rate and Rhythm: Normal rate.     Pulses: Normal pulses.  Pulmonary:     Effort: Pulmonary effort is normal. No respiratory distress.  Abdominal:     General: There is no distension.     Palpations: Abdomen is soft.  Musculoskeletal:        General: No  tenderness or signs of injury.     Cervical back: Neck supple.     Right lower leg: No edema.     Left lower leg: No edema.     Comments: Patient has good distal strength without clonus. Patient somewhat slow to rise from a seated position to full extension.  There is concordant low back pain with facet loading and lumbar spine extension rotation.  There are no definitive trigger points but the patient is somewhat tender across the lower back and PSIS.  There is no pain with hip rotation.   Skin:    Findings: No erythema or rash.  Neurological:     General: No  focal deficit present.     Mental Status: He is alert and oriented to person, place, and time.     Sensory: No sensory deficit.     Motor: No weakness or abnormal muscle tone.     Coordination: Coordination normal.  Psychiatric:        Mood and Affect: Mood normal.        Behavior: Behavior normal.    Ortho Exam  Imaging: No results found.  Past Medical/Family/Surgical/Social History: Medications & Allergies reviewed per EMR, new medications updated. Patient Active Problem List   Diagnosis Date Noted   S/P total left hip arthroplasty 10/09/2020   S/P total hip arthroplasty 10/09/2020   Arthritis of carpometacarpal [facility]) joint of left thumb 08/23/2019   Malaise and fatigue 05/26/2019   Depressive disorder 04/29/2019   Perennial allergic rhinitis 01/16/2016   Chronic sinusitis 01/16/2016   Tobacco use 01/16/2016   Myofascial pain syndrome 10/02/2015   Generalized anxiety disorder 08/04/2013   Lumbosacral spondylosis 12/29/2012   Thoracic or lumbosacral neuritis or radiculitis 12/29/2012   Past Medical History:  Diagnosis Date   Allergic rhinitis    Back pain    Coronary artery disease    4 stents   DDD (degenerative disc disease), cervical    thoracic and lumbar   DDD (degenerative disc disease), lumbar    Depression    Headache    Heart disease    Hepatitis    C treated 15 years ago    Hyperlipidemia     Hypertension    Hypothyroidism    Panic attacks    Pre-diabetes    Sinusitis    Family History  Problem Relation Age of Onset   Allergic rhinitis Neg Hx    Angioedema Neg Hx    Asthma Neg Hx    Eczema Neg Hx    Immunodeficiency Neg Hx    Urticaria Neg Hx    Past Surgical History:  Procedure Laterality Date   APPENDECTOMY  1974   CHOLECYSTECTOMY  2012   epideral steroid injections     in cervical,thoracic and lumbar   stents  2008,2009   3 heart stents 1st surgery,then 1 stent 2cd surgery   TONSILLECTOMY     TOTAL HIP ARTHROPLASTY Left 10/09/2020   Procedure: TOTAL HIP ARTHROPLASTY ANTERIOR APPROACH;  Surgeon: Durene Romans, MD;  Location: [facility] ORS;  Service: Orthopedics;  Laterality: Left;  70 mins   Social History   Occupational History   Not on file  Tobacco Use   Smoking status: Every Day    Packs/day: 0.25    Years: 42.00    Pack years: 10.50    Types: Cigarettes   Smokeless tobacco: Never   Tobacco comments:    pack every two weeks 4 cigs a day  Vaping Use   Vaping Use: Never used  Substance and Sexual Activity   Alcohol use: No   Drug use: No   Sexual activity: Not Currently

## 2021-09-07 NOTE — Procedures (Signed)
Lumbosacral Transforaminal Epidural Steroid Injection - Sub-Pedicular Approach with Fluoroscopic Guidance  Patient: Justin Saunders      Date of Birth: 1955-09-12 MRN: [id number] PCP: Leola Brazil, DO      Visit Date: 09/03/2021   Universal Protocol:    Date/Time: 09/03/2021  Consent Given By: the patient  Position: PRONE  Additional Comments: Vital signs were monitored before and after the procedure. Patient was prepped and draped in the usual sterile fashion. The correct patient, procedure, and site was verified.   Injection Procedure Details:   Procedure diagnoses: Lumbar radiculopathy [M54.16]    Meds Administered:  Meds ordered this encounter  Medications   methylPREDNISolone acetate (DEPO-MEDROL) injection 80 mg    Laterality: Right  Location/Site: L4  Needle:5.0 in., 22 ga.  Short bevel or Quincke spinal needle  Needle Placement: Transforaminal  Findings:    -Comments: Excellent flow of contrast along the nerve, nerve root and into the epidural space.  Procedure Details: After squaring off the end-plates to get a true AP view, the C-arm was positioned so that an oblique view of the foramen as noted above was visualized. The target area is just inferior to the "nose of the scotty dog" or sub pedicular. The soft tissues overlying this structure were infiltrated with 2-3 ml. of 1% Lidocaine without Epinephrine.  The spinal needle was inserted toward the target using a "trajectory" view along the fluoroscope beam.  Under AP and lateral visualization, the needle was advanced so it did not puncture dura and was located close the 6 O'Clock position of the pedical in AP tracterory. Biplanar projections were used to confirm position. Aspiration was confirmed to be negative for CSF and/or blood. A 1-2 ml. volume of Isovue-250 was injected and flow of contrast was noted at each level. Radiographs were obtained for documentation purposes.   After attaining the desired  flow of contrast documented above, a 0.5 to 1.0 ml test dose of 0.25% Marcaine was injected into each respective transforaminal space.  The patient was observed for 90 seconds post injection.  After no sensory deficits were reported, and normal lower extremity motor function was noted,   the above injectate was administered so that equal amounts of the injectate were placed at each foramen (level) into the transforaminal epidural space.   Additional Comments:  The patient tolerated the procedure well Dressing: 2 x 2 sterile gauze and Band-Aid    Post-procedure details: Patient was observed during the procedure. Post-procedure instructions were reviewed.  Patient left the clinic in stable condition.

## 2021-09-09 ENCOUNTER — Telehealth: Payer: Self-pay | Admitting: Physical Medicine and Rehabilitation

## 2021-09-09 NOTE — Telephone Encounter (Signed)
Pt called stated injection received last week did not work and would like a call back to discuss next option. Please call pt at [phone].

## 2021-09-16 ENCOUNTER — Other Ambulatory Visit: Payer: Self-pay | Admitting: Physical Medicine and Rehabilitation

## 2021-09-16 DIAGNOSIS — G8929 Other chronic pain: Secondary | ICD-10-CM

## 2021-09-17 ENCOUNTER — Telehealth: Payer: Self-pay

## 2021-09-17 MED ORDER — TRAMADOL HCL 50 MG PO TABS: 50.0000 mg | ORAL_TABLET | Freq: Three times a day (TID) | ORAL | 0 refills | Status: AC | PRN

## 2021-09-17 NOTE — Telephone Encounter (Signed)
Tramadol Sent in

## 2021-09-17 NOTE — Telephone Encounter (Signed)
Called pt and sch RFA on April 18th. Pt requesting any Rx for the pain until his appt. Please advise

## 2021-11-05 ENCOUNTER — Ambulatory Visit: Payer: Self-pay

## 2021-11-05 ENCOUNTER — Encounter: Payer: Self-pay | Admitting: Physical Medicine and Rehabilitation

## 2021-11-05 ENCOUNTER — Ambulatory Visit: Payer: Medicare Other | Admitting: Physical Medicine and Rehabilitation

## 2021-11-05 DIAGNOSIS — M47816 Spondylosis without myelopathy or radiculopathy, lumbar region: Secondary | ICD-10-CM | POA: Diagnosis not present

## 2021-11-05 MED ORDER — METHYLPREDNISOLONE ACETATE 80 MG/ML IJ SUSP
80.0000 mg | Freq: Once | INTRAMUSCULAR | Status: AC
  Administered 2021-11-05: 80 mg

## 2021-11-05 NOTE — Patient Instructions (Signed)

## 2021-11-05 NOTE — Progress Notes (Signed)
Pt state lower back pain . Pt state walking and standing makes the pain worse. Pt state he takes pain meds to help ease his pain. ? ?Numeric Pain Rating Scale and Functional Assessment ?Average Pain 6 ? ? ?In the last MONTH (on 0-10 scale) has pain interfered with the following? ? ?1. General activity like being  able to carry out your everyday physical activities such as walking, climbing stairs, carrying groceries, or moving a chair?  ?Rating(9) ? ? ?+Driver, -BT, -Dye Allergies. ? ?

## 2021-11-12 ENCOUNTER — Ambulatory Visit: Payer: Self-pay

## 2021-11-12 ENCOUNTER — Encounter: Payer: Self-pay | Admitting: Physical Medicine and Rehabilitation

## 2021-11-12 ENCOUNTER — Ambulatory Visit: Payer: Medicare Other | Admitting: Physical Medicine and Rehabilitation

## 2021-11-12 VITALS — BP 138/90 | HR 55

## 2021-11-12 DIAGNOSIS — M47816 Spondylosis without myelopathy or radiculopathy, lumbar region: Secondary | ICD-10-CM | POA: Diagnosis not present

## 2021-11-12 MED ORDER — METHYLPREDNISOLONE ACETATE 80 MG/ML IJ SUSP
80.0000 mg | Freq: Once | INTRAMUSCULAR | Status: AC
  Administered 2021-11-12: 80 mg

## 2021-11-12 NOTE — Patient Instructions (Signed)

## 2021-11-12 NOTE — Progress Notes (Signed)
Pt state lower back pain . Pt state walking and standing makes the pain worse. Pt state he takes pain meds to help ease his pain. ? ?Numeric Pain Rating Scale and Functional Assessment ?Average Pain 2 ? ? ?In the last MONTH (on 0-10 scale) has pain interfered with the following? ? ?1. General activity like being  able to carry out your everyday physical activities such as walking, climbing stairs, carrying groceries, or moving a chair?  ?Rating(7) ? ? ?+Driver, -BT, -Dye Allergies. ? ?

## 2021-11-13 NOTE — Progress Notes (Signed)
? ?Justin Saunders - 66 y.o. male MRN [id number]  Date of birth: October 25, 1955 ? ?Office Visit Note: ?Visit Date: 11/05/2021 ?PCP: Leola Brazil, DO ?Referred by: Leola Brazil, DO ? ?Subjective: ?Chief Complaint  ?Patient presents with  ? Lower Back - Pain  ? ?HPI:  Justin Saunders is a 66 y.o. male who comes in todayfor planned repeat radiofrequency ablation of the Right L2-3,  and L3-4 Lumbar facet joints. This would be ablation of the corresponding medial branches and/or dorsal rami.  Patient has had double diagnostic blocks with more than 70% relief.  Subsequent ablation gave them more than 6 months of over 60% relief.  They have had chronic back pain for quite some time, more than 3 months, which has been an ongoing situation with recalcitrant axial back pain.  They have no radicular pain.  Their axial pain is worse with standing and ambulating and on exam today with facet loading.  They have had physical therapy as well as home exercise program.  The imaging noted in the chart below indicated facet pathology. Accordingly they meet all the criteria and qualification for for radiofrequency ablation and we are going to complete this today hopefully for more longer term relief as part of comprehensive management program.  ? ?ROS Otherwise per HPI. ? ?Assessment & Plan: ?Visit Diagnoses:  ?  ICD-10-CM   ?1. Spondylosis without myelopathy or radiculopathy, lumbar region  M47.816 XR C-ARM NO REPORT  ?  Radiofrequency,Lumbar  ?  methylPREDNISolone acetate (DEPO-MEDROL) injection 80 mg  ?  ?  ?Plan: No additional findings.  ? ?Meds & Orders:  ?Meds ordered this encounter  ?Medications  ? methylPREDNISolone acetate (DEPO-MEDROL) injection 80 mg  ?  ?Orders Placed This Encounter  ?Procedures  ? Radiofrequency,Lumbar  ? XR C-ARM NO REPORT  ?  ?Follow-up: Return if symptoms worsen or fail to improve.  ? ?Procedures: ?No procedures performed  ?Lumbar Facet Joint Nerve Denervation ? ?Patient: Justin Saunders      ?Date of  Birth: May 21, 1956 ?MRN: [id number] ?PCP: Leola Brazil, DO      ?Visit Date: 11/05/2021 ?  ?Universal Protocol:    ?Date/Time: 04/26/235:41 AM ? ?Consent Given By: the patient ? ?Position: PRONE ? ?Additional Comments: ?Vital signs were monitored before and after the procedure. ?Patient was prepped and draped in the usual sterile fashion. ?The correct patient, procedure, and site was verified. ? ? ?Injection Procedure Details:  ? ?Procedure diagnoses:  ?1. Spondylosis without myelopathy or radiculopathy, lumbar region   ?  ? ?Meds Administered:  ?Meds ordered this encounter  ?Medications  ? methylPREDNISolone acetate (DEPO-MEDROL) injection 80 mg  ?  ? ?Laterality: Right ? ?Location/Site:  L2-L3, L1 and L2 medial branches and L3-L4, L2 and L3 medial branches ? ?Needle: 18 ga.,  54mm active tip, RF Cannula ? ?Needle Placement: Along juncture of superior articular process and transverse pocess ? ?Findings: ? -Comments: ? ?Procedure Details: ?For each desired target nerve, the corresponding transverse process (sacral ala for the L5 dorsal rami) was identified and the fluoroscope was positioned to square off the endplates of the corresponding vertebral body to achieve a true AP midline view.  The beam was then obliqued 15 to 20 degrees and caudally tilted 15 to 20 degrees to line up a trajectory along the target nerves. The skin over the target of the junction of superior articulating process and transverse process (sacral ala for the L5 dorsal rami) was infiltrated with 2ml of 1% Lidocaine  without Epinephrine.  The 18 gauge 105mm active tip outer cannula was advanced in trajectory view to the target. ? ?This procedure was repeated for each target nerve.  Then, for all levels, the outer cannula placement was fine-tuned and the position was then confirmed with bi-planar imaging.   ? ?Test stimulation was done both at sensory and motor levels to ensure there was no radicular stimulation. The target tissues were  then infiltrated with 1 ml of 1% Lidocaine without Epinephrine. Subsequently, a percutaneous neurotomy was carried out for 90 seconds at 80 degrees Celsius.  After the completion of the lesion, 1 ml of injectate was delivered. It was then repeated for each facet joint nerve mentioned above. Appropriate radiographs were obtained to verify the probe placement during the neurotomy. ? ? ?Additional Comments:  ?The patient tolerated the procedure well ?Dressing: 2 x 2 sterile gauze and Band-Aid ?  ? ?Post-procedure details: ?Patient was observed during the procedure. ?Post-procedure instructions were reviewed. ? ?Patient left the clinic in stable condition. ? ? ?  ? ?Clinical History: ?No specialty comments available.  ? ? ? ?Objective:  VS:  HT:    WT:   BMI:     BP:   HR: bpm  TEMP: ( )  RESP:  ?Physical Exam ?Vitals and nursing note reviewed.  ?Constitutional:   ?   General: He is not in acute distress. ?   Appearance: Normal appearance. He is not ill-appearing.  ?HENT:  ?   Head: Normocephalic and atraumatic.  ?   Right Ear: External ear normal.  ?   Left Ear: External ear normal.  ?   Nose: No congestion.  ?Eyes:  ?   Extraocular Movements: Extraocular movements intact.  ?Cardiovascular:  ?   Rate and Rhythm: Normal rate.  ?   Pulses: Normal pulses.  ?Pulmonary:  ?   Effort: Pulmonary effort is normal. No respiratory distress.  ?Abdominal:  ?   General: There is no distension.  ?   Palpations: Abdomen is soft.  ?Musculoskeletal:     ?   General: No tenderness or signs of injury.  ?   Cervical back: Neck supple.  ?   Right lower leg: No edema.  ?   Left lower leg: No edema.  ?   Comments: Patient has good distal strength without clonus.  ?Skin: ?   Findings: No erythema or rash.  ?Neurological:  ?   General: No focal deficit present.  ?   Mental Status: He is alert and oriented to person, place, and time.  ?   Sensory: No sensory deficit.  ?   Motor: No weakness or abnormal muscle tone.  ?   Coordination:  Coordination normal.  ?Psychiatric:     ?   Mood and Affect: Mood normal.     ?   Behavior: Behavior normal.  ?  ? ?Imaging: ?XR C-ARM NO REPORT ? ?Result Date: 11/12/2021 ?Please see Notes tab for imaging impression.  ? ?

## 2021-11-13 NOTE — Procedures (Signed)
Lumbar Facet Joint Nerve Denervation ? ?Patient: Justin Saunders      ?Date of Birth: 24-Jun-1956 ?MRN: [id number] ?PCP: Leola Brazil, DO      ?Visit Date: 11/05/2021 ?  ?Universal Protocol:    ?Date/Time: 04/26/235:41 AM ? ?Consent Given By: the patient ? ?Position: PRONE ? ?Additional Comments: ?Vital signs were monitored before and after the procedure. ?Patient was prepped and draped in the usual sterile fashion. ?The correct patient, procedure, and site was verified. ? ? ?Injection Procedure Details:  ? ?Procedure diagnoses:  ?1. Spondylosis without myelopathy or radiculopathy, lumbar region   ?  ? ?Meds Administered:  ?Meds ordered this encounter  ?Medications  ? methylPREDNISolone acetate (DEPO-MEDROL) injection 80 mg  ?  ? ?Laterality: Right ? ?Location/Site:  L2-L3, L1 and L2 medial branches and L3-L4, L2 and L3 medial branches ? ?Needle: 18 ga.,  37mm active tip, RF Cannula ? ?Needle Placement: Along juncture of superior articular process and transverse pocess ? ?Findings: ? -Comments: ? ?Procedure Details: ?For each desired target nerve, the corresponding transverse process (sacral ala for the L5 dorsal rami) was identified and the fluoroscope was positioned to square off the endplates of the corresponding vertebral body to achieve a true AP midline view.  The beam was then obliqued 15 to 20 degrees and caudally tilted 15 to 20 degrees to line up a trajectory along the target nerves. The skin over the target of the junction of superior articulating process and transverse process (sacral ala for the L5 dorsal rami) was infiltrated with 35ml of 1% Lidocaine without Epinephrine.  The 18 gauge 80mm active tip outer cannula was advanced in trajectory view to the target. ? ?This procedure was repeated for each target nerve.  Then, for all levels, the outer cannula placement was fine-tuned and the position was then confirmed with bi-planar imaging.   ? ?Test stimulation was done both at sensory and motor  levels to ensure there was no radicular stimulation. The target tissues were then infiltrated with 1 ml of 1% Lidocaine without Epinephrine. Subsequently, a percutaneous neurotomy was carried out for 90 seconds at 80 degrees Celsius.  After the completion of the lesion, 1 ml of injectate was delivered. It was then repeated for each facet joint nerve mentioned above. Appropriate radiographs were obtained to verify the probe placement during the neurotomy. ? ? ?Additional Comments:  ?The patient tolerated the procedure well ?Dressing: 2 x 2 sterile gauze and Band-Aid ?  ? ?Post-procedure details: ?Patient was observed during the procedure. ?Post-procedure instructions were reviewed. ? ?Patient left the clinic in stable condition. ? ? ? ?

## 2021-11-13 NOTE — Progress Notes (Signed)
? ?Justin Saunders - 66 y.o. male MRN [id number]  Date of birth: [birth year] ? ?Office Visit Note: ?Visit Date: 11/12/2021 ?PCP: Leola Brazil, DO ?Referred by: Leola Brazil, DO ? ?Subjective: ?Chief Complaint  ?Patient presents with  ? Lower Back - Pain  ? ?HPI:  Justin Saunders is a 66 y.o. male who comes in todayfor planned repeat radiofrequency ablation of the Left L2-3, L3-4, and L4-L5  Lumbar facet joints. This would be ablation of the corresponding medial branches and/or dorsal rami.  Patient has had double diagnostic blocks with more than 70% relief.  Subsequent ablation gave them more than 6 months of over 60% relief.  They have had chronic back pain for quite some time, more than 3 months, which has been an ongoing situation with recalcitrant axial back pain.  They have no radicular pain.  Their axial pain is worse with standing and ambulating and on exam today with facet loading.  They have had physical therapy as well as home exercise program.  The imaging noted in the chart below indicated facet pathology. Accordingly they meet all the criteria and qualification for for radiofrequency ablation and we are going to complete this today hopefully for more longer term relief as part of comprehensive management program. ? ?ROS Otherwise per HPI. ? ?Assessment & Plan: ?Visit Diagnoses:  ?  ICD-10-CM   ?1. Spondylosis without myelopathy or radiculopathy, lumbar region  M47.816 XR C-ARM NO REPORT  ?  Radiofrequency,Lumbar  ?  methylPREDNISolone acetate (DEPO-MEDROL) injection 80 mg  ?  ?  ?Plan: No additional findings.  ? ?Meds & Orders:  ?Meds ordered this encounter  ?Medications  ? methylPREDNISolone acetate (DEPO-MEDROL) injection 80 mg  ?  ?Orders Placed This Encounter  ?Procedures  ? Radiofrequency,Lumbar  ? XR C-ARM NO REPORT  ?  ?Follow-up: Return if symptoms worsen or fail to improve.  ? ?Procedures: ?No procedures performed  ?Lumbar Facet Joint Nerve Denervation ? ?Patient: Justin Saunders      ?Date of Birth: 10/06/1955 ?MRN: [id number] ?PCP: Leola Brazil, DO      ?Visit Date: 11/12/2021 ?  ?Universal Protocol:    ?Date/Time: 04/26/235:43 AM ? ?Consent Given By: the patient ? ?Position: PRONE ? ?Additional Comments: ?Vital signs were monitored before and after the procedure. ?Patient was prepped and draped in the usual sterile fashion. ?The correct patient, procedure, and site was verified. ? ? ?Injection Procedure Details:  ? ?Procedure diagnoses:  ?1. Spondylosis without myelopathy or radiculopathy, lumbar region   ?  ? ?Meds Administered:  ?Meds ordered this encounter  ?Medications  ? methylPREDNISolone acetate (DEPO-MEDROL) injection 80 mg  ?  ? ?Laterality: Left ? ?Location/Site:  L2-L3, L1 and L2 medial branches and L3-L4, L2 and L3 medial branches ? ?Needle: 18 ga.,  65mm active tip, RF Cannula ? ?Needle Placement: Along juncture of superior articular process and transverse pocess ? ?Findings: ? -Comments: ? ?Procedure Details: ?For each desired target nerve, the corresponding transverse process (sacral ala for the L5 dorsal rami) was identified and the fluoroscope was positioned to square off the endplates of the corresponding vertebral body to achieve a true AP midline view.  The beam was then obliqued 15 to 20 degrees and caudally tilted 15 to 20 degrees to line up a trajectory along the target nerves. The skin over the target of the junction of superior articulating process and transverse process (sacral ala for the L5 dorsal rami) was infiltrated with 65ml of 1% Lidocaine  without Epinephrine.  The 18 gauge 74mm active tip outer cannula was advanced in trajectory view to the target. ? ?This procedure was repeated for each target nerve.  Then, for all levels, the outer cannula placement was fine-tuned and the position was then confirmed with bi-planar imaging.   ? ?Test stimulation was done both at sensory and motor levels to ensure there was no radicular stimulation. The  target tissues were then infiltrated with 1 ml of 1% Lidocaine without Epinephrine. Subsequently, a percutaneous neurotomy was carried out for 90 seconds at 80 degrees Celsius.  After the completion of the lesion, 1 ml of injectate was delivered. It was then repeated for each facet joint nerve mentioned above. Appropriate radiographs were obtained to verify the probe placement during the neurotomy. ? ? ?Additional Comments:  ?No complications occurred ?Dressing: 2 x 2 sterile gauze and Band-Aid ?  ? ?Post-procedure details: ?Patient was observed during the procedure. ?Post-procedure instructions were reviewed. ? ?Patient left the clinic in stable condition. ? ? ?  ? ?Clinical History: ?No specialty comments available.  ? ? ? ?Objective:  VS:  HT:    WT:   BMI:     BP:138/90  HR:(!) 55bpm  TEMP: ( )  RESP:  ?Physical Exam ?Vitals and nursing note reviewed.  ?Constitutional:   ?   General: He is not in acute distress. ?   Appearance: Normal appearance. He is not ill-appearing.  ?HENT:  ?   Head: Normocephalic and atraumatic.  ?   Right Ear: External ear normal.  ?   Left Ear: External ear normal.  ?   Nose: No congestion.  ?Eyes:  ?   Extraocular Movements: Extraocular movements intact.  ?Cardiovascular:  ?   Rate and Rhythm: Normal rate.  ?   Pulses: Normal pulses.  ?Pulmonary:  ?   Effort: Pulmonary effort is normal. No respiratory distress.  ?Abdominal:  ?   General: There is no distension.  ?   Palpations: Abdomen is soft.  ?Musculoskeletal:     ?   General: No tenderness or signs of injury.  ?   Cervical back: Neck supple.  ?   Right lower leg: No edema.  ?   Left lower leg: No edema.  ?   Comments: Patient has good distal strength without clonus.  ?Skin: ?   Findings: No erythema or rash.  ?Neurological:  ?   General: No focal deficit present.  ?   Mental Status: He is alert and oriented to person, place, and time.  ?   Sensory: No sensory deficit.  ?   Motor: No weakness or abnormal muscle tone.  ?    Coordination: Coordination normal.  ?Psychiatric:     ?   Mood and Affect: Mood normal.     ?   Behavior: Behavior normal.  ?  ? ?Imaging: ?XR C-ARM NO REPORT ? ?Result Date: 11/12/2021 ?Please see Notes tab for imaging impression.  ? ?

## 2021-11-13 NOTE — Procedures (Signed)
Lumbar Facet Joint Nerve Denervation ? ?Patient: Justin Saunders      ?Date of Birth: 07-22-1955 ?MRN: [id number] ?PCP: Leola Brazil, DO      ?Visit Date: 11/12/2021 ?  ?Universal Protocol:    ?Date/Time: 04/26/235:43 AM ? ?Consent Given By: the patient ? ?Position: PRONE ? ?Additional Comments: ?Vital signs were monitored before and after the procedure. ?Patient was prepped and draped in the usual sterile fashion. ?The correct patient, procedure, and site was verified. ? ? ?Injection Procedure Details:  ? ?Procedure diagnoses:  ?1. Spondylosis without myelopathy or radiculopathy, lumbar region   ?  ? ?Meds Administered:  ?Meds ordered this encounter  ?Medications  ? methylPREDNISolone acetate (DEPO-MEDROL) injection 80 mg  ?  ? ?Laterality: Left ? ?Location/Site:  L2-L3, L1 and L2 medial branches and L3-L4, L2 and L3 medial branches ? ?Needle: 18 ga.,  63mm active tip, RF Cannula ? ?Needle Placement: Along juncture of superior articular process and transverse pocess ? ?Findings: ? -Comments: ? ?Procedure Details: ?For each desired target nerve, the corresponding transverse process (sacral ala for the L5 dorsal rami) was identified and the fluoroscope was positioned to square off the endplates of the corresponding vertebral body to achieve a true AP midline view.  The beam was then obliqued 15 to 20 degrees and caudally tilted 15 to 20 degrees to line up a trajectory along the target nerves. The skin over the target of the junction of superior articulating process and transverse process (sacral ala for the L5 dorsal rami) was infiltrated with 82ml of 1% Lidocaine without Epinephrine.  The 18 gauge 34mm active tip outer cannula was advanced in trajectory view to the target. ? ?This procedure was repeated for each target nerve.  Then, for all levels, the outer cannula placement was fine-tuned and the position was then confirmed with bi-planar imaging.   ? ?Test stimulation was done both at sensory and motor  levels to ensure there was no radicular stimulation. The target tissues were then infiltrated with 1 ml of 1% Lidocaine without Epinephrine. Subsequently, a percutaneous neurotomy was carried out for 90 seconds at 80 degrees Celsius.  After the completion of the lesion, 1 ml of injectate was delivered. It was then repeated for each facet joint nerve mentioned above. Appropriate radiographs were obtained to verify the probe placement during the neurotomy. ? ? ?Additional Comments:  ?No complications occurred ?Dressing: 2 x 2 sterile gauze and Band-Aid ?  ? ?Post-procedure details: ?Patient was observed during the procedure. ?Post-procedure instructions were reviewed. ? ?Patient left the clinic in stable condition. ? ? ? ?

## 2022-01-24 IMAGING — RF DG HIP (WITH PELVIS) OPERATIVE*L*
1 series · 2 of 2 positions shown · non-contrast
Comparison: None.

CLINICAL DATA: Left hip replacement

EXAM:
OPERATIVE LEFT HIP (WITH PELVIS IF PERFORMED) 1 VIEW
TECHNIQUE: Fluoroscopic spot image(s) were submitted for interpretation
post-operatively.

[Series 1: unknown protocol · 0.20mm/px · 2 of 2 slices shown]
[im 1/2]
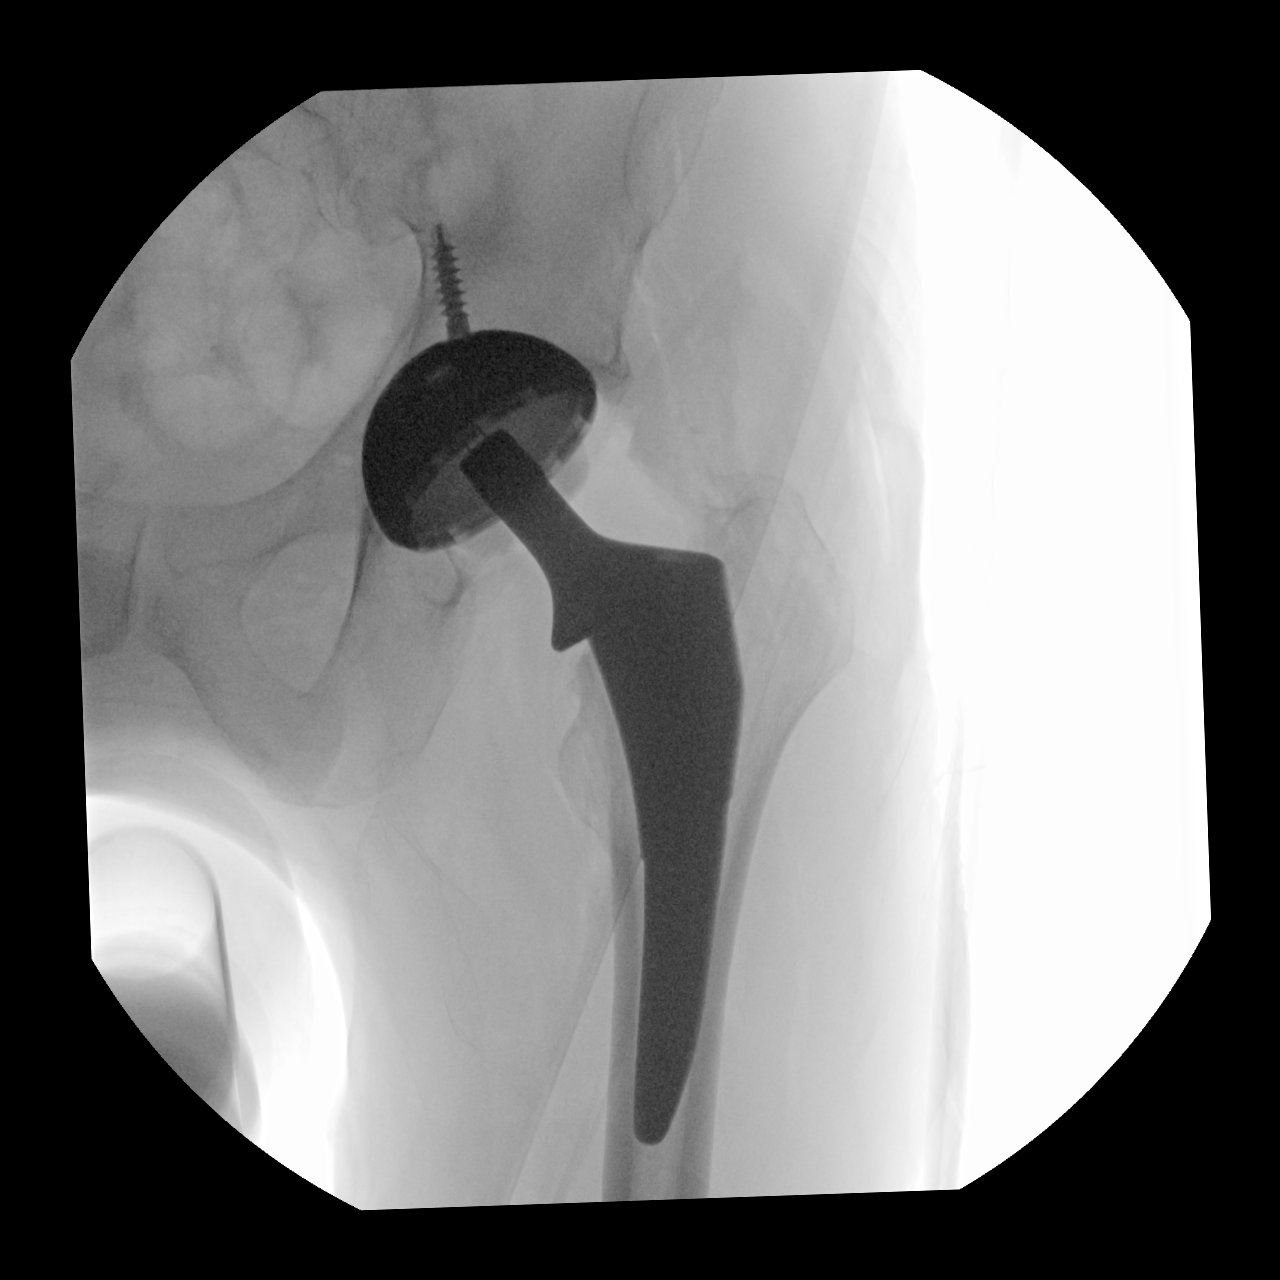
[im 2/2]
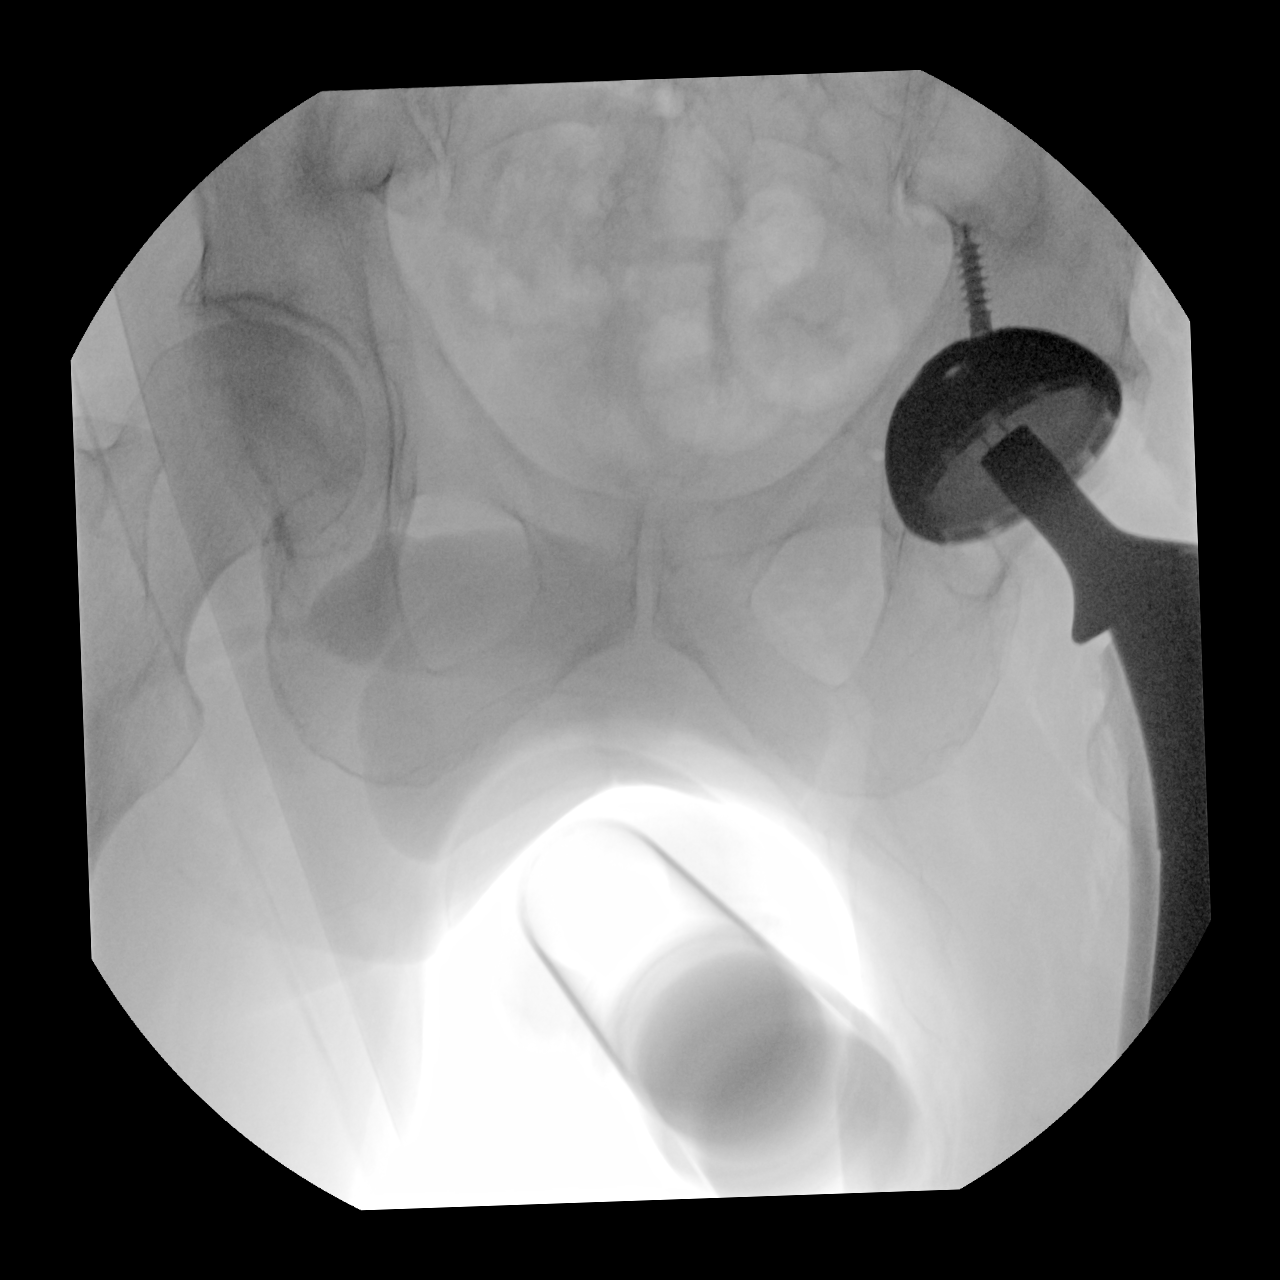

[2 of 2 positions shown; findings below may reference images not displayed]

FINDINGS: Two frontal views of the left hip demonstrating total hip
arthroplasty with single acetabular screw. No evidence of
complication.
IMPRESSION: Left total hip arthroplasty.

## 2022-02-06 ENCOUNTER — Ambulatory Visit: Payer: Medicare Other | Admitting: Physical Medicine and Rehabilitation

## 2022-02-06 ENCOUNTER — Encounter: Payer: Self-pay | Admitting: Physical Medicine and Rehabilitation

## 2022-02-06 DIAGNOSIS — M47816 Spondylosis without myelopathy or radiculopathy, lumbar region: Secondary | ICD-10-CM | POA: Diagnosis not present

## 2022-02-06 DIAGNOSIS — G8929 Other chronic pain: Secondary | ICD-10-CM

## 2022-02-06 DIAGNOSIS — M545 Low back pain, unspecified: Secondary | ICD-10-CM | POA: Diagnosis not present

## 2022-02-06 NOTE — Progress Notes (Signed)
Pt state lower back pain and middle back [ain. Pt state walking and standing makes the pain worse. Pt state he takes pain meds to help ease his pain. Pt has hx of inj RFA and it helped until he fell three weeks ago , the pain is back.  Numeric Pain Rating Scale and Functional Assessment Average Pain 10 Pain Right Now 8 My pain is intermittent, sharp, burning, dull, stabbing, tingling, and aching Pain is worse with: walking, bending, sitting, standing, some activites, and laying down Pain improves with: heat/ice, medication, and injections   In the last MONTH (on 0-10 scale) has pain interfered with the following?  1. General activity like being  able to carry out your everyday physical activities such as walking, climbing stairs, carrying groceries, or moving a chair?  Rating(5)  2. Relation with others like being able to carry out your usual social activities and roles such as  activities at home, at work and in your community. Rating(6)  3. Enjoyment of life such that you have  been bothered by emotional problems such as feeling anxious, depressed or irritable?  Rating(7)

## 2022-02-06 NOTE — Progress Notes (Signed)
Justin Saunders - 66 y.o. male MRN [id number]  Date of birth: 04-Dec-1955  Office Visit Note: Visit Date: 02/06/2022 PCP: Nicola Girt, DO Referred by: Nicola Girt, DO  Subjective: Chief Complaint  Patient presents with   Lower Back - Pain   HPI: Justin Saunders is a 66 y.o. male who comes in today for evaluation of chronic, worsening and severe bilateral lower back pain. Patient reports lower lumbar pain started over a month ago after fall at home in backyard. We have treated patient in the past for more upper lumbar pain, however he now reports lower lumbar discomfort is severe. Patient states pain is exacerbated by movement and activity, specifically moving from sitting to standing position. He describes his pain as sore and aching, currently rates as 8 out of 10. He reports some relief of pain with rest and use of over the counter medications. Lumbar MRI imaging from 2021 exhibits multilevel disc and facet degeneration, there is also multilevel mild-to-moderate lateral recess and neural foraminal stenosis. No high grade spinal canal stenosis noted. Patient underwent bilateral L2-L3 and L3-L4 radiofrequency ablation in our office in April of this year, he reports significant and sustained pain relief of upper lumbar pain with this procedure. Patient states bilateral lower back pain is negatively impacting his life and ability to perform tasks. Patient denies focal weakness, numbness and tingling.   Review of Systems  Musculoskeletal:  Positive for back pain.  Neurological:  Negative for tingling, sensory change, focal weakness and weakness.  All other systems reviewed and are negative.  Otherwise per HPI.  Assessment & Plan: Visit Diagnoses:    ICD-10-CM   1. Spondylosis without myelopathy or radiculopathy, lumbar region  M47.816 Ambulatory referral to Physical Medicine Rehab    2. Chronic bilateral low back pain without sciatica  M54.50 Ambulatory referral to Physical Medicine  Rehab   G89.29     3. Facet arthropathy, lumbar  M47.816 Ambulatory referral to Physical Medicine Rehab       Plan: Findings:  Chronic, worsening and severe bilateral lower back pain. Patient continues to have severe pain despite good conservative therapies such as rest and use of medications. Patient did get significant and sustained upper lumbar relief with radiofrequency ablation procedure in April, however bilateral lower back pain remains severe. Patients clinical presentation and exam are consistent with facet mediated pain. He does have severe pain with lumbar extension upon exam today. We believe the next step is to perform diagnostic and hopefully therapeutic bilateral L4-L5 and L5-S1 facet joint injections under fluoroscopic guidance. Patient has no questions about lumbar injection procedure at this time. No red flag symptoms noted upon exam today.     Meds & Orders: No orders of the defined types were placed in this encounter.   Orders Placed This Encounter  Procedures   Ambulatory referral to Physical Medicine Rehab    Follow-up: Return for Bilateral L4-L5 and L5-S1 facet joint injections.   Procedures: No procedures performed      Clinical History: EXAM: MRI LUMBAR SPINE WITHOUT CONTRAST   TECHNIQUE: Multiplanar, multisequence MR imaging of the lumbar spine was performed. No intravenous contrast was administered.   COMPARISON:  07/22/2016   FINDINGS: Segmentation: For consistency with the prior MRI report, the lowest fully formed intervertebral disc space is designated L5-S1. With this numbering, there are small ribs at L1.   Alignment: Slight chronic reversal of the normal lumbar lordosis. No listhesis.   Vertebrae: No fracture or suspicious osseous  lesion. Predominantly chronic degenerative endplate changes at L2-3.   Conus medullaris and cauda equina: Conus extends to the L2 level. Conus and cauda equina appear normal.   Paraspinal and other soft tissues:  Unremarkable.   Disc levels:   Disc desiccation throughout the lumbar spine with chronic severe disc space narrowing at L2-3 and mild narrowing at the other levels.   T12-L1 and L1-2: Negative.   L2-3: Circumferential disc osteophyte complex and severe disc space height loss result in mild bilateral lateral recess stenosis and mild right and mild-to-moderate left neural foraminal stenosis without significant generalized spinal stenosis, unchanged.   L3-4: Circumferential disc bulging eccentric to the left results in mild left greater than right lateral recess stenosis and mild right and moderate left neural foraminal stenosis without significant generalized spinal stenosis, unchanged.   L4-5: Circumferential disc bulging and mild facet hypertrophy result in moderate bilateral lateral recess stenosis and mild-to-moderate bilateral neural foraminal stenosis without significant generalized spinal stenosis, unchanged.   L5-S1: Circumferential disc bulging and mild facet hypertrophy result in mild-to-moderate bilateral lateral recess stenosis and mild-to-moderate right and moderate left neural foraminal stenosis without spinal stenosis, unchanged.   IMPRESSION: Unchanged lumbar disc and facet degeneration resulting in mild-to-moderate lateral recess and neural foraminal stenosis as above.     Electronically Signed   By: Sebastian Ache M.D.   On: 06/16/2020 16:24   He reports that he has been smoking cigarettes. He has a 10.50 pack-year smoking history. He has never used smokeless tobacco. No results for input(s): "HGBA1C", "LABURIC" in the last 8760 hours.  Objective:  VS:  HT:    WT:   BMI:     BP:   HR: bpm  TEMP: ( )  RESP:  Physical Exam Vitals and nursing note reviewed.  HENT:     Head: Normocephalic and atraumatic.     Right Ear: External ear normal.     Left Ear: External ear normal.     Mouth/Throat:     Mouth: Mucous membranes are moist.  Eyes:      Extraocular Movements: Extraocular movements intact.  Cardiovascular:     Rate and Rhythm: Normal rate.     Pulses: Normal pulses.  Pulmonary:     Effort: Pulmonary effort is normal.  Abdominal:     General: Abdomen is flat. There is no distension.  Musculoskeletal:        General: Tenderness present.     Cervical back: Normal range of motion.     Comments: Pt is slow to rise from seated position to standing. Concordant low back pain with facet loading, lumbar spine extension and rotation. Strong distal strength without clonus, no pain upon palpation of greater trochanters. Sensation intact bilaterally. Walks independently, gait steady.   Skin:    General: Skin is warm and dry.     Capillary Refill: Capillary refill takes less than 2 seconds.  Neurological:     General: No focal deficit present.     Mental Status: He is alert and oriented to person, place, and time.  Psychiatric:        Mood and Affect: Mood normal.        Behavior: Behavior normal.     Ortho Exam  Imaging: No results found.  Past Medical/Family/Surgical/Social History: Medications & Allergies reviewed per EMR, new medications updated. Patient Active Problem List   Diagnosis Date Noted   S/P total left hip arthroplasty 10/09/2020   S/P total hip arthroplasty 10/09/2020   Arthritis of carpometacarpal (  CMC) joint of left thumb 08/23/2019   Malaise and fatigue 05/26/2019   Depressive disorder 04/29/2019   Perennial allergic rhinitis 01/16/2016   Chronic sinusitis 01/16/2016   Tobacco use 01/16/2016   Myofascial pain syndrome 10/02/2015   Generalized anxiety disorder 08/04/2013   Lumbosacral spondylosis 12/29/2012   Thoracic or lumbosacral neuritis or radiculitis 12/29/2012   Past Medical History:  Diagnosis Date   Allergic rhinitis    Back pain    Coronary artery disease    4 stents   DDD (degenerative disc disease), cervical    thoracic and lumbar   DDD (degenerative disc disease), lumbar     Depression    Headache    Heart disease    Hepatitis    C treated 15 years ago    Hyperlipidemia    Hypertension    Hypothyroidism    Panic attacks    Pre-diabetes    Sinusitis    Family History  Problem Relation Age of Onset   Allergic rhinitis Neg Hx    Angioedema Neg Hx    Asthma Neg Hx    Eczema Neg Hx    Immunodeficiency Neg Hx    Urticaria Neg Hx    Past Surgical History:  Procedure Laterality Date   APPENDECTOMY  1974   CHOLECYSTECTOMY  2012   epideral steroid injections     in cervical,thoracic and lumbar   stents  2008,2009   3 heart stents 1st surgery,then 1 stent 2cd surgery   TONSILLECTOMY     TOTAL HIP ARTHROPLASTY Left 10/09/2020   Procedure: TOTAL HIP ARTHROPLASTY ANTERIOR APPROACH;  Surgeon: Durene Romans, MD;  Location: [facility];  Service: Orthopedics;  Laterality: Left;  70 mins   Social History   Occupational History   Not on file  Tobacco Use   Smoking status: Every Day    Packs/day: 0.25    Years: 42.00    Total pack years: 10.50    Types: Cigarettes   Smokeless tobacco: Never   Tobacco comments:    pack every two weeks 4 cigs a day  Vaping Use   Vaping Use: Never used  Substance and Sexual Activity   Alcohol use: No   Drug use: No   Sexual activity: Not Currently

## 2022-02-20 ENCOUNTER — Ambulatory Visit: Payer: Self-pay

## 2022-02-20 ENCOUNTER — Encounter: Payer: Self-pay | Admitting: Physical Medicine and Rehabilitation

## 2022-02-20 ENCOUNTER — Ambulatory Visit: Payer: Medicare Other | Admitting: Physical Medicine and Rehabilitation

## 2022-02-20 VITALS — BP 162/67 | HR 70

## 2022-02-20 DIAGNOSIS — M47816 Spondylosis without myelopathy or radiculopathy, lumbar region: Secondary | ICD-10-CM | POA: Diagnosis not present

## 2022-02-20 MED ORDER — BUPIVACAINE HCL 0.5 % IJ SOLN
3.0000 mL | Freq: Once | INTRAMUSCULAR | Status: AC
  Administered 2022-02-20: 3 mL

## 2022-02-20 NOTE — Progress Notes (Signed)
Pt state lower back pain and middle back pain. Pt state walking and standing makes the pain worse. Pt state he takes pain meds to help ease his pain.  Numeric Pain Rating Scale and Functional Assessment Average Pain 8   In the last MONTH (on 0-10 scale) has pain interfered with the following?  1. General activity like being  able to carry out your everyday physical activities such as walking, climbing stairs, carrying groceries, or moving a chair?  Rating(10)   +Driver, -BT, -Dye Allergies.

## 2022-02-20 NOTE — Patient Instructions (Addendum)

## 2022-02-24 ENCOUNTER — Encounter: Payer: Self-pay | Admitting: Physical Medicine and Rehabilitation

## 2022-03-02 NOTE — Progress Notes (Signed)
ELMAN DETTMAN - 66 y.o. male MRN [id number]  Date of birth: 1956-03-30  Office Visit Note: Visit Date: 02/20/2022 PCP: Leola Brazil, DO Referred by: Leola Brazil, DO  Subjective: Chief Complaint  Patient presents with   Lower Back - Pain   Middle Back - Pain   HPI:  Justin Saunders is a 66 y.o. male who comes in today at the request of Ellin Goodie, FNP for planned Bilateral  L4-5 and L5-S1 Lumbar facet/medial branch block with fluoroscopic guidance.  The patient has failed conservative care including home exercise, medications, time and activity modification.  This injection will be diagnostic and hopefully therapeutic.  Please see requesting physician notes for further details and justification.  Exam has shown concordant pain with facet joint loading.   ROS Otherwise per HPI.  Assessment & Plan: Visit Diagnoses:    ICD-10-CM   1. Spondylosis without myelopathy or radiculopathy, lumbar region  M47.816 XR C-ARM NO REPORT    Facet Injection    bupivacaine (MARCAINE) 0.5 % (with pres) injection 3 mL      Plan: No additional findings.   Meds & Orders:  Meds ordered this encounter  Medications   bupivacaine (MARCAINE) 0.5 % (with pres) injection 3 mL    Orders Placed This Encounter  Procedures   Facet Injection   XR C-ARM NO REPORT    Follow-up: Return for Review Pain Diary.   Procedures: No procedures performed  Lumbar Diagnostic Facet Joint Nerve Block with Fluoroscopic Guidance   Patient: Justin Saunders      Date of Birth: Apr 28, 1956 MRN: [id number] PCP: Leola Brazil, DO      Visit Date: 02/20/2022   Universal Protocol:    Date/Time: 08/13/239:08 PM  Consent Given By: the patient  Position: PRONE  Additional Comments: Vital signs were monitored before and after the procedure. Patient was prepped and draped in the usual sterile fashion. The correct patient, procedure, and site was verified.   Injection Procedure Details:   Procedure  diagnoses:  1. Spondylosis without myelopathy or radiculopathy, lumbar region      Meds Administered:  Meds ordered this encounter  Medications   bupivacaine (MARCAINE) 0.5 % (with pres) injection 3 mL     Laterality: Bilateral  Location/Site: L4-L5, L3 and L4 medial branches and L5-S1, L4 medial branch and L5 dorsal ramus  Needle: 5.0 in., 25 ga.  Short bevel or Quincke spinal needle  Needle Placement: Oblique pedical  Findings:   -Comments: There was excellent flow of contrast along the articular pillars without intravascular flow.  Procedure Details: The fluoroscope beam is vertically oriented in AP and then obliqued 15 to 20 degrees to the ipsilateral side of the desired nerve to achieve the "Scotty dog" appearance.  The skin over the target area of the junction of the superior articulating process and the transverse process (sacral ala if blocking the L5 dorsal rami) was locally anesthetized with a 1 ml volume of 1% Lidocaine without Epinephrine.  The spinal needle was inserted and advanced in a trajectory view down to the target.   After contact with periosteum and negative aspirate for blood and CSF, correct placement without intravascular or epidural spread was confirmed by injecting 0.5 ml. of Isovue-250.  A spot radiograph was obtained of this image.    Next, a 0.5 ml. volume of the injectate described above was injected. The needle was then redirected to the other facet joint nerves mentioned above if needed.  Prior to  the procedure, the patient was given a Pain Diary which was completed for baseline measurements.  After the procedure, the patient rated their pain every 30 minutes and will continue rating at this frequency for a total of 5 hours.  The patient has been asked to complete the Diary and return to [place] by mail, fax or hand delivered as soon as possible.   Additional Comments:  No complications occurred Dressing: 2 x 2 sterile gauze and Band-Aid     Post-procedure details: Patient was observed during the procedure. Post-procedure instructions were reviewed.  Patient left the clinic in stable condition.   Clinical History: EXAM: MRI LUMBAR SPINE WITHOUT CONTRAST   TECHNIQUE: Multiplanar, multisequence MR imaging of the lumbar spine was performed. No intravenous contrast was administered.   COMPARISON:  07/22/2016   FINDINGS: Segmentation: For consistency with the prior MRI report, the lowest fully formed intervertebral disc space is designated L5-S1. With this numbering, there are small ribs at L1.   Alignment: Slight chronic reversal of the normal lumbar lordosis. No listhesis.   Vertebrae: No fracture or suspicious osseous lesion. Predominantly chronic degenerative endplate changes at L2-3.   Conus medullaris and cauda equina: Conus extends to the L2 level. Conus and cauda equina appear normal.   Paraspinal and other soft tissues: Unremarkable.   Disc levels:   Disc desiccation throughout the lumbar spine with chronic severe disc space narrowing at L2-3 and mild narrowing at the other levels.   T12-L1 and L1-2: Negative.   L2-3: Circumferential disc osteophyte complex and severe disc space height loss result in mild bilateral lateral recess stenosis and mild right and mild-to-moderate left neural foraminal stenosis without significant generalized spinal stenosis, unchanged.   L3-4: Circumferential disc bulging eccentric to the left results in mild left greater than right lateral recess stenosis and mild right and moderate left neural foraminal stenosis without significant generalized spinal stenosis, unchanged.   L4-5: Circumferential disc bulging and mild facet hypertrophy result in moderate bilateral lateral recess stenosis and mild-to-moderate bilateral neural foraminal stenosis without significant generalized spinal stenosis, unchanged.   L5-S1: Circumferential disc bulging and mild facet  hypertrophy result in mild-to-moderate bilateral lateral recess stenosis and mild-to-moderate right and moderate left neural foraminal stenosis without spinal stenosis, unchanged.   IMPRESSION: Unchanged lumbar disc and facet degeneration resulting in mild-to-moderate lateral recess and neural foraminal stenosis as above.     Electronically Signed   By: Sebastian Ache M.D.   On: 06/16/2020 16:24     Objective:  VS:  HT:    WT:   BMI:     BP:(!) 162/67  HR:70bpm  TEMP: ( )  RESP:  Physical Exam Vitals and nursing note reviewed.  Constitutional:      General: He is not in acute distress.    Appearance: Normal appearance. He is not ill-appearing.  HENT:     Head: Normocephalic and atraumatic.     Right Ear: External ear normal.     Left Ear: External ear normal.     Nose: No congestion.  Eyes:     Extraocular Movements: Extraocular movements intact.  Cardiovascular:     Rate and Rhythm: Normal rate.     Pulses: Normal pulses.  Pulmonary:     Effort: Pulmonary effort is normal. No respiratory distress.  Abdominal:     General: There is no distension.     Palpations: Abdomen is soft.  Musculoskeletal:        General: No tenderness or signs of injury.  Cervical back: Neck supple.     Right lower leg: No edema.     Left lower leg: No edema.     Comments: Patient has good distal strength without clonus.  Skin:    Findings: No erythema or rash.  Neurological:     General: No focal deficit present.     Mental Status: He is alert and oriented to person, place, and time.     Sensory: No sensory deficit.     Motor: No weakness or abnormal muscle tone.     Coordination: Coordination normal.  Psychiatric:        Mood and Affect: Mood normal.        Behavior: Behavior normal.      Imaging: No results found.

## 2022-03-02 NOTE — Procedures (Signed)
Lumbar Diagnostic Facet Joint Nerve Block with Fluoroscopic Guidance   Patient: Justin Saunders      Date of Birth: 07/02/1956 MRN: [id number] PCP: Leola Brazil, DO      Visit Date: 02/20/2022   Universal Protocol:    Date/Time: 08/13/239:08 PM  Consent Given By: the patient  Position: PRONE  Additional Comments: Vital signs were monitored before and after the procedure. Patient was prepped and draped in the usual sterile fashion. The correct patient, procedure, and site was verified.   Injection Procedure Details:   Procedure diagnoses:  1. Spondylosis without myelopathy or radiculopathy, lumbar region      Meds Administered:  Meds ordered this encounter  Medications   bupivacaine (MARCAINE) 0.5 % (with pres) injection 3 mL     Laterality: Bilateral  Location/Site: L4-L5, L3 and L4 medial branches and L5-S1, L4 medial branch and L5 dorsal ramus  Needle: 5.0 in., 25 ga.  Short bevel or Quincke spinal needle  Needle Placement: Oblique pedical  Findings:   -Comments: There was excellent flow of contrast along the articular pillars without intravascular flow.  Procedure Details: The fluoroscope beam is vertically oriented in AP and then obliqued 15 to 20 degrees to the ipsilateral side of the desired nerve to achieve the "Scotty dog" appearance.  The skin over the target area of the junction of the superior articulating process and the transverse process (sacral ala if blocking the L5 dorsal rami) was locally anesthetized with a 1 ml volume of 1% Lidocaine without Epinephrine.  The spinal needle was inserted and advanced in a trajectory view down to the target.   After contact with periosteum and negative aspirate for blood and CSF, correct placement without intravascular or epidural spread was confirmed by injecting 0.5 ml. of Isovue-250.  A spot radiograph was obtained of this image.    Next, a 0.5 ml. volume of the injectate described above was injected. The needle  was then redirected to the other facet joint nerves mentioned above if needed.  Prior to the procedure, the patient was given a Pain Diary which was completed for baseline measurements.  After the procedure, the patient rated their pain every 30 minutes and will continue rating at this frequency for a total of 5 hours.  The patient has been asked to complete the Diary and return to [place] by mail, fax or hand delivered as soon as possible.   Additional Comments:  No complications occurred Dressing: 2 x 2 sterile gauze and Band-Aid    Post-procedure details: Patient was observed during the procedure. Post-procedure instructions were reviewed.  Patient left the clinic in stable condition.

## 2022-03-12 ENCOUNTER — Other Ambulatory Visit: Payer: Self-pay | Admitting: Physical Medicine and Rehabilitation

## 2022-03-12 MED ORDER — PREDNISONE 10 MG PO TABS: 20.0000 mg | ORAL_TABLET | Freq: Every day | ORAL | 0 refills | Status: DC

## 2022-03-12 NOTE — Progress Notes (Signed)
Prednisone given at his wife's appointment

## 2022-03-18 ENCOUNTER — Ambulatory Visit (INDEPENDENT_AMBULATORY_CARE_PROVIDER_SITE_OTHER): Payer: Medicare Other | Admitting: Physical Medicine and Rehabilitation

## 2022-03-18 ENCOUNTER — Ambulatory Visit: Payer: Self-pay

## 2022-03-18 ENCOUNTER — Encounter: Payer: Self-pay | Admitting: Physical Medicine and Rehabilitation

## 2022-03-18 VITALS — BP 140/76 | HR 48

## 2022-03-18 DIAGNOSIS — M47816 Spondylosis without myelopathy or radiculopathy, lumbar region: Secondary | ICD-10-CM | POA: Diagnosis not present

## 2022-03-18 MED ORDER — BUPIVACAINE HCL 0.5 % IJ SOLN
3.0000 mL | Freq: Once | INTRAMUSCULAR | Status: AC
  Administered 2022-03-18: 3 mL

## 2022-03-18 NOTE — Patient Instructions (Signed)

## 2022-03-18 NOTE — Progress Notes (Signed)
Pt state lower back pain. Pt state walking and standing makes the pain worse. Pt state he takes pain meds to help ease his pain.  Numeric Pain Rating Scale and Functional Assessment Average Pain 7   In the last MONTH (on 0-10 scale) has pain interfered with the following?  1. General activity like being  able to carry out your everyday physical activities such as walking, climbing stairs, carrying groceries, or moving a chair?  Rating(10)   +Driver, -BT, -Dye Allergies.

## 2022-03-19 ENCOUNTER — Telehealth: Payer: Self-pay | Admitting: Physical Medicine and Rehabilitation

## 2022-03-19 NOTE — Telephone Encounter (Signed)
Pt called and states the first set of injections were better than the one yesterday. He said when he came in his back was a 10 and the injection brought it back down to a 6 then later back up to a 10. He said that he thinks "it made the point worse" He said  [place] will know what hes talking about.   Cb [phone]

## 2022-03-21 NOTE — Procedures (Signed)
Lumbar Diagnostic Facet Joint Nerve Block with Fluoroscopic Guidance   Patient: Justin Saunders      Date of Birth: [birth year] MRN: [id number] PCP: Leola Brazil, DO      Visit Date: 03/18/2022   Universal Protocol:    Date/Time: 09/01/239:46 AM  Consent Given By: the patient  Position: PRONE  Additional Comments: Vital signs were monitored before and after the procedure. Patient was prepped and draped in the usual sterile fashion. The correct patient, procedure, and site was verified.   Injection Procedure Details:   Procedure diagnoses:  1. Spondylosis without myelopathy or radiculopathy, lumbar region      Meds Administered:  Meds ordered this encounter  Medications   bupivacaine (MARCAINE) 0.5 % (with pres) injection 3 mL     Laterality: Bilateral  Location/Site: L4-L5, L3 and L4 medial branches and L5-S1, L4 medial branch and L5 dorsal ramus  Needle: 5.0 in., 25 ga.  Short bevel or Quincke spinal needle  Needle Placement: Oblique pedical  Findings:   -Comments: There was excellent flow of contrast along the articular pillars without intravascular flow.  Procedure Details: The fluoroscope beam is vertically oriented in AP and then obliqued 15 to 20 degrees to the ipsilateral side of the desired nerve to achieve the "Scotty dog" appearance.  The skin over the target area of the junction of the superior articulating process and the transverse process (sacral ala if blocking the L5 dorsal rami) was locally anesthetized with a 1 ml volume of 1% Lidocaine without Epinephrine.  The spinal needle was inserted and advanced in a trajectory view down to the target.   After contact with periosteum and negative aspirate for blood and CSF, correct placement without intravascular or epidural spread was confirmed by injecting 0.5 ml. of Isovue-250.  A spot radiograph was obtained of this image.    Next, a 0.5 ml. volume of the injectate described above was injected. The needle  was then redirected to the other facet joint nerves mentioned above if needed.  Prior to the procedure, the patient was given a Pain Diary which was completed for baseline measurements.  After the procedure, the patient rated their pain every 30 minutes and will continue rating at this frequency for a total of 5 hours.  The patient has been asked to complete the Diary and return to [place] by mail, fax or hand delivered as soon as possible.   Additional Comments:  The patient tolerated the procedure well Dressing: 2 x 2 sterile gauze and Band-Aid    Post-procedure details: Patient was observed during the procedure. Post-procedure instructions were reviewed.  Patient left the clinic in stable condition.

## 2022-03-21 NOTE — Progress Notes (Signed)
Justin Saunders - 66 y.o. male MRN [id number]  Date of birth: 1955-08-27  Office Visit Note: Visit Date: 03/18/2022 PCP: Leola Brazil, DO Referred by: Leola Brazil, DO  Subjective: Chief Complaint  Patient presents with   Lower Back - Pain   HPI:  Justin Saunders is a 67 y.o. male who comes in today for planned repeat Bilateral L4-5 and L5-S1 Lumbar facet/medial branch block with fluoroscopic guidance.  The patient has failed conservative care including home exercise, medications, time and activity modification.  This injection will be diagnostic and hopefully therapeutic.  Please see requesting physician notes for further details and justification.  Exam shows concordant low back pain with facet joint loading and extension. Patient received more than 80% pain relief from prior injection. This would be the second block in a diagnostic double block paradigm.  Also discussed briefly about a new area of pain on the right really at the level of the iliac crest laterally somewhat in the area of the quadratus lumborum.  He reports a new focal area of pain they are hard for him to get up from a seated position.     Referring:Megan Mayford Knife, FNP   ROS Otherwise per HPI.  Assessment & Plan: Visit Diagnoses:    ICD-10-CM   1. Spondylosis without myelopathy or radiculopathy, lumbar region  M47.816 XR C-ARM NO REPORT    Facet Injection    bupivacaine (MARCAINE) 0.5 % (with pres) injection 3 mL      Plan: No additional findings.   Meds & Orders:  Meds ordered this encounter  Medications   bupivacaine (MARCAINE) 0.5 % (with pres) injection 3 mL    Orders Placed This Encounter  Procedures   Facet Injection   XR C-ARM NO REPORT    Follow-up: Return for Review Pain Diary.   Procedures: No procedures performed  Lumbar Diagnostic Facet Joint Nerve Block with Fluoroscopic Guidance   Patient: Justin Saunders      Date of Birth: Nov 23, 1955 MRN: [id number] PCP: Leola Brazil,  DO      Visit Date: 03/18/2022   Universal Protocol:    Date/Time: 09/01/239:46 AM  Consent Given By: the patient  Position: PRONE  Additional Comments: Vital signs were monitored before and after the procedure. Patient was prepped and draped in the usual sterile fashion. The correct patient, procedure, and site was verified.   Injection Procedure Details:   Procedure diagnoses:  1. Spondylosis without myelopathy or radiculopathy, lumbar region      Meds Administered:  Meds ordered this encounter  Medications   bupivacaine (MARCAINE) 0.5 % (with pres) injection 3 mL     Laterality: Bilateral  Location/Site: L4-L5, L3 and L4 medial branches and L5-S1, L4 medial branch and L5 dorsal ramus  Needle: 5.0 in., 25 ga.  Short bevel or Quincke spinal needle  Needle Placement: Oblique pedical  Findings:   -Comments: There was excellent flow of contrast along the articular pillars without intravascular flow.  Procedure Details: The fluoroscope beam is vertically oriented in AP and then obliqued 15 to 20 degrees to the ipsilateral side of the desired nerve to achieve the "Scotty dog" appearance.  The skin over the target area of the junction of the superior articulating process and the transverse process (sacral ala if blocking the L5 dorsal rami) was locally anesthetized with a 1 ml volume of 1% Lidocaine without Epinephrine.  The spinal needle was inserted and advanced in a trajectory view down to the target.  After contact with periosteum and negative aspirate for blood and CSF, correct placement without intravascular or epidural spread was confirmed by injecting 0.5 ml. of Isovue-250.  A spot radiograph was obtained of this image.    Next, a 0.5 ml. volume of the injectate described above was injected. The needle was then redirected to the other facet joint nerves mentioned above if needed.  Prior to the procedure, the patient was given a Pain Diary which was completed for  baseline measurements.  After the procedure, the patient rated their pain every 30 minutes and will continue rating at this frequency for a total of 5 hours.  The patient has been asked to complete the Diary and return to [place] by mail, fax or hand delivered as soon as possible.   Additional Comments:  The patient tolerated the procedure well Dressing: 2 x 2 sterile gauze and Band-Aid    Post-procedure details: Patient was observed during the procedure. Post-procedure instructions were reviewed.  Patient left the clinic in stable condition.   Clinical History: EXAM: MRI LUMBAR SPINE WITHOUT CONTRAST   TECHNIQUE: Multiplanar, multisequence MR imaging of the lumbar spine was performed. No intravenous contrast was administered.   COMPARISON:  07/22/2016   FINDINGS: Segmentation: For consistency with the prior MRI report, the lowest fully formed intervertebral disc space is designated L5-S1. With this numbering, there are small ribs at L1.   Alignment: Slight chronic reversal of the normal lumbar lordosis. No listhesis.   Vertebrae: No fracture or suspicious osseous lesion. Predominantly chronic degenerative endplate changes at L2-3.   Conus medullaris and cauda equina: Conus extends to the L2 level. Conus and cauda equina appear normal.   Paraspinal and other soft tissues: Unremarkable.   Disc levels:   Disc desiccation throughout the lumbar spine with chronic severe disc space narrowing at L2-3 and mild narrowing at the other levels.   T12-L1 and L1-2: Negative.   L2-3: Circumferential disc osteophyte complex and severe disc space height loss result in mild bilateral lateral recess stenosis and mild right and mild-to-moderate left neural foraminal stenosis without significant generalized spinal stenosis, unchanged.   L3-4: Circumferential disc bulging eccentric to the left results in mild left greater than right lateral recess stenosis and mild right and moderate left  neural foraminal stenosis without significant generalized spinal stenosis, unchanged.   L4-5: Circumferential disc bulging and mild facet hypertrophy result in moderate bilateral lateral recess stenosis and mild-to-moderate bilateral neural foraminal stenosis without significant generalized spinal stenosis, unchanged.   L5-S1: Circumferential disc bulging and mild facet hypertrophy result in mild-to-moderate bilateral lateral recess stenosis and mild-to-moderate right and moderate left neural foraminal stenosis without spinal stenosis, unchanged.   IMPRESSION: Unchanged lumbar disc and facet degeneration resulting in mild-to-moderate lateral recess and neural foraminal stenosis as above.     Electronically Signed   By: Sebastian Ache M.D.   On: 06/16/2020 16:24     Objective:  VS:  HT:    WT:   BMI:     BP:(!) 140/76  HR:(!) 48bpm  TEMP: ( )  RESP:  Physical Exam Vitals and nursing note reviewed.  Constitutional:      General: He is not in acute distress.    Appearance: Normal appearance. He is not ill-appearing.  HENT:     Head: Normocephalic and atraumatic.     Right Ear: External ear normal.     Left Ear: External ear normal.     Nose: No congestion.  Eyes:     Extraocular  Movements: Extraocular movements intact.  Cardiovascular:     Rate and Rhythm: Normal rate.     Pulses: Normal pulses.  Pulmonary:     Effort: Pulmonary effort is normal. No respiratory distress.  Abdominal:     General: There is no distension.     Palpations: Abdomen is soft.  Musculoskeletal:        General: No tenderness or signs of injury.     Cervical back: Neck supple.     Right lower leg: No edema.     Left lower leg: No edema.     Comments: Patient has good distal strength without clonus.  Skin:    Findings: No erythema or rash.  Neurological:     General: No focal deficit present.     Mental Status: He is alert and oriented to person, place, and time.     Sensory: No sensory  deficit.     Motor: No weakness or abnormal muscle tone.     Coordination: Coordination normal.  Psychiatric:        Mood and Affect: Mood normal.        Behavior: Behavior normal.      Imaging: No results found.

## 2022-04-02 ENCOUNTER — Ambulatory Visit: Payer: Medicare Other | Admitting: Physical Medicine and Rehabilitation

## 2022-04-02 ENCOUNTER — Encounter: Payer: Self-pay | Admitting: Physical Medicine and Rehabilitation

## 2022-04-02 VITALS — BP 150/87 | Ht 65.0 in | Wt 116.0 lb

## 2022-04-02 DIAGNOSIS — M7918 Myalgia, other site: Secondary | ICD-10-CM | POA: Diagnosis not present

## 2022-04-02 DIAGNOSIS — G8929 Other chronic pain: Secondary | ICD-10-CM

## 2022-04-02 DIAGNOSIS — M47816 Spondylosis without myelopathy or radiculopathy, lumbar region: Secondary | ICD-10-CM | POA: Diagnosis not present

## 2022-04-02 DIAGNOSIS — M545 Low back pain, unspecified: Secondary | ICD-10-CM

## 2022-04-02 DIAGNOSIS — M25552 Pain in left hip: Secondary | ICD-10-CM

## 2022-04-02 DIAGNOSIS — M25551 Pain in right hip: Secondary | ICD-10-CM | POA: Diagnosis not present

## 2022-04-02 MED ORDER — PREDNISONE 50 MG PO TABS: 50.0000 mg | ORAL_TABLET | Freq: Every day | ORAL | 0 refills | Status: AC

## 2022-04-02 NOTE — Progress Notes (Signed)
Justin Saunders - 66 y.o. male MRN [id number]  Date of birth: Oct 16, 1955  Office Visit Note: Visit Date: 04/02/2022 PCP: Leola Brazil, DO Referred by: Leola Brazil, DO  Subjective: Chief Complaint  Patient presents with   Lower Back - Pain   HPI: Justin Saunders is a 66 y.o. male who comes in today for evaluation of chronic, worsening and severe bilateral lower back pain.  He also reports a new localized pain to left lumbar paraspinal region. States pain ongoing for several weeks and increased after performing yard work. Describes his pain as a sore and aching sensation, currently rates a 7 out of 10.  Some relief of pain with home exercise regimen, rest and use of medications. Lumbar MRI imaging from 2021 exhibits multilevel disc and facet degeneration, there is also multilevel mild-to-moderate lateral recess and neural foraminal stenosis. No high grade spinal canal stenosis noted. Patient underwent bilateral L2-L3 and L3-L4 radiofrequency ablation in our office in April of this year, he reports significant and sustained pain relief of upper lumbar pain with this procedure. More recently patient underwent bilateral L4-L5 and L5-S1 facet joint/medial branch blocks, first set of diagnostic blocks provided 100% pain relief, second set of diagnostic blocks provided 30% relief. Patient states he is a very active person, does play pool frequently. Patient denies recent trauma or falls.    Review of Systems  Musculoskeletal:  Positive for back pain.  Neurological:  Negative for tingling, sensory change, focal weakness and weakness.  All other systems reviewed and are negative.  Otherwise per HPI.  Assessment & Plan: Visit Diagnoses:    ICD-10-CM   1. Chronic bilateral low back pain without sciatica  M54.50 Trigger Point Inj   G89.29     2. Spondylosis without myelopathy or radiculopathy, lumbar region  M47.816 Trigger Point Inj    3. Facet arthropathy, lumbar  M47.816 Trigger Point  Inj    4. Myofascial pain syndrome  M79.18 Trigger Point Inj    5. Bilateral hip pain  M25.551 Trigger Point Inj   M25.552        Plan: Findings:  1. Chronic worsening and severe bilateral axial back pain.  Patient continues to have severe pain despite good conservative therapy such as home exercise regimen, rest and use of medications.  Patient's clinical presentation and exam are consistent with facet mediated pain, pain noted with lumbar extension today.  Patient did not get significant relief of pain with second set of diagnostic facet blocks therefore we will not proceed with radiofrequency ablation procedure. We did discuss medication management today with patient, I prescribed short course of Prednisone and instructed him to let us know how he is feeling in approximately 2 weeks. If his pain persists we could look at re-grouping with physical therapy and other possible medications. No red flag symptoms noted upon exam today.   2. Acute on chronic localized pain to left lumbar paraspinal region.  Patient continues to have severe pain despite stretching exercises and medications at home.  Patient's clinical presentation and background are consistent with myofascial pain syndrome, he does have palpable trigger point noted to left quadratus lumborum region. I did perform myofascial trigger point injection in the office today to left quadratus lumborum region without difficulty. Patient encouraged to use ice pack at home if area is sore/bruised later. I did discuss possible re-grouping with PT for dry needling, however patient does not wish to repeat this treatment.     Meds & Orders:  Meds ordered this encounter  Medications   predniSONE (DELTASONE) 50 MG tablet    Sig: Take 1 tablet (50 mg total) by mouth daily with breakfast. Take until completed.    Dispense:  5 tablet    Refill:  0    Orders Placed This Encounter  Procedures   Trigger Point Inj    Follow-up: Return if symptoms worsen  or fail to improve.   Procedures: Trigger Point Inj  Date/Time: 04/02/2022 2:55 PM  Performed by: Lorine Bears, NP Authorized by: Lorine Bears, NP   Consent Given by:  Patient Site marked: the procedure site was marked   Timeout: prior to procedure the correct patient, procedure, and site was verified   Indications:  Pain Total # of Trigger Points:  1 Location: back   Needle Size:  25 G Approach:  Dorsal Patient tolerance:  Patient tolerated the procedure well with no immediate complications Comments: Muscle tendon injection provided along the quadratus lumborum and iliac crest.       Clinical History: EXAM: MRI LUMBAR SPINE WITHOUT CONTRAST   TECHNIQUE: Multiplanar, multisequence MR imaging of the lumbar spine was performed. No intravenous contrast was administered.   COMPARISON:  07/22/2016   FINDINGS: Segmentation: For consistency with the prior MRI report, the lowest fully formed intervertebral disc space is designated L5-S1. With this numbering, there are small ribs at L1.   Alignment: Slight chronic reversal of the normal lumbar lordosis. No listhesis.   Vertebrae: No fracture or suspicious osseous lesion. Predominantly chronic degenerative endplate changes at [id number].   Conus medullaris and cauda equina: Conus extends to the L2 level. Conus and cauda equina appear normal.   Paraspinal and other soft tissues: Unremarkable.   Disc levels:   Disc desiccation throughout the lumbar spine with chronic severe disc space narrowing at L2-3 and mild narrowing at the other levels.   T12-L1 and L1-2: Negative.   L2-3: Circumferential disc osteophyte complex and severe disc space height loss result in mild bilateral lateral recess stenosis and mild right and mild-to-moderate left neural foraminal stenosis without significant generalized spinal stenosis, unchanged.   L3-4: Circumferential disc bulging eccentric to the left results in mild left greater than  right lateral recess stenosis and mild right and moderate left neural foraminal stenosis without significant generalized spinal stenosis, unchanged.   L4-5: Circumferential disc bulging and mild facet hypertrophy result in moderate bilateral lateral recess stenosis and mild-to-moderate bilateral neural foraminal stenosis without significant generalized spinal stenosis, unchanged.   L5-S1: Circumferential disc bulging and mild facet hypertrophy result in mild-to-moderate bilateral lateral recess stenosis and mild-to-moderate right and moderate left neural foraminal stenosis without spinal stenosis, unchanged.   IMPRESSION: Unchanged lumbar disc and facet degeneration resulting in mild-to-moderate lateral recess and neural foraminal stenosis as above.     Electronically Signed   By: Logan Bores M.D.   On: 06/16/2020 16:24   He reports that he has been smoking cigarettes. He has a 10.50 pack-year smoking history. He has never used smokeless tobacco. No results for input(s): "HGBA1C", "LABURIC" in the last 8760 hours.  Objective:  VS:  HT:5\' 5"  (165.1 cm)   WT:116 lb (52.6 kg)  BMI:19.3    BP:(!) 150/87  HR: bpm  TEMP: ( )  RESP:  Physical Exam Vitals and nursing note reviewed.  HENT:     Head: Normocephalic and atraumatic.     Right Ear: External ear normal.     Left Ear: External ear normal.  Nose: Nose normal.     Mouth/Throat:     Mouth: Mucous membranes are moist.  Eyes:     Pupils: Pupils are equal, round, and reactive to light.  Cardiovascular:     Rate and Rhythm: Normal rate.     Pulses: Normal pulses.  Pulmonary:     Effort: Pulmonary effort is normal.  Abdominal:     General: Abdomen is flat. There is no distension.  Musculoskeletal:        General: Tenderness present.     Cervical back: Normal range of motion.     Comments: Pt is slow to rise from seated position to standing. Concordant low back pain with facet loading, lumbar spine extension and  rotation. Strong distal strength without clonus, no pain upon palpation of greater trochanters. Sensation intact bilaterally. Palpable trigger point to left quadratus lumborum region. Walks independently, gait steady.   Skin:    General: Skin is warm and dry.     Capillary Refill: Capillary refill takes less than 2 seconds.  Neurological:     General: No focal deficit present.     Mental Status: He is alert and oriented to person, place, and time.  Psychiatric:        Mood and Affect: Mood normal.        Behavior: Behavior normal.     Ortho Exam  Imaging: No results found.  Past Medical/Family/Surgical/Social History: Medications & Allergies reviewed per EMR, new medications updated. Patient Active Problem List   Diagnosis Date Noted   S/P total left hip arthroplasty 10/09/2020   S/P total hip arthroplasty 10/09/2020   Arthritis of carpometacarpal [facility]) joint of left thumb 08/23/2019   Malaise and fatigue 05/26/2019   Depressive disorder 04/29/2019   Perennial allergic rhinitis 01/16/2016   Chronic sinusitis 01/16/2016   Tobacco use 01/16/2016   Myofascial pain syndrome 10/02/2015   Generalized anxiety disorder 08/04/2013   Lumbosacral spondylosis 12/29/2012   Thoracic or lumbosacral neuritis or radiculitis 12/29/2012   Past Medical History:  Diagnosis Date   Allergic rhinitis    Back pain    Coronary artery disease    4 stents   DDD (degenerative disc disease), cervical    thoracic and lumbar   DDD (degenerative disc disease), lumbar    Depression    Headache    Heart disease    Hepatitis    C treated 15 years ago    Hyperlipidemia    Hypertension    Hypothyroidism    Panic attacks    Pre-diabetes    Sinusitis    Family History  Problem Relation Age of Onset   Allergic rhinitis Neg Hx    Angioedema Neg Hx    Asthma Neg Hx    Eczema Neg Hx    Immunodeficiency Neg Hx    Urticaria Neg Hx    Past Surgical History:  Procedure Laterality Date    APPENDECTOMY  1974   CHOLECYSTECTOMY  2012   epideral steroid injections     in cervical,thoracic and lumbar   stents  2008,2009   3 heart stents 1st surgery,then 1 stent 2cd surgery   TONSILLECTOMY     TOTAL HIP ARTHROPLASTY Left 10/09/2020   Procedure: TOTAL HIP ARTHROPLASTY ANTERIOR APPROACH;  Surgeon: Durene Romans, MD;  Location: [facility];  Service: Orthopedics;  Laterality: Left;  70 mins   Social History   Occupational History   Not on file  Tobacco Use   Smoking status: Every Day    Packs/day: 0.25  Years: 42.00    Total pack years: 10.50    Types: Cigarettes   Smokeless tobacco: Never   Tobacco comments:    pack every two weeks 4 cigs a day  Vaping Use   Vaping Use: Never used  Substance and Sexual Activity   Alcohol use: No   Drug use: No   Sexual activity: Not Currently

## 2022-04-02 NOTE — Progress Notes (Signed)
Patient is here with chronic low back pain.  The pain seems to move around, but is mostly dead center and very low into the tailbone. He denies pain down either leg. He denies numbness or tingling in the legs or toes. Patient is taking Meloxicam, which takes the edge off, but does not necessarily help.  Patient has had facet blocks. He states that the first set seemed to help, but he had barely any relief from second set.  Numeric Pain Rating Scale and Functional Assessment Average Pain 6   In the last MONTH (on 0-10 scale) has pain interfered with the following?  1. General activity like being  able to carry out your everyday physical activities such as walking, climbing stairs, carrying groceries, or moving a chair?  Rating(8)

## 2024-09-27 ENCOUNTER — Ambulatory Visit (HOSPITAL_COMMUNITY): Admit: 2024-09-27 | Admitting: Orthopedic Surgery

## 2024-09-27 DIAGNOSIS — M1611 Unilateral primary osteoarthritis, right hip: Secondary | ICD-10-CM

## 2024-09-27 SURGERY — ARTHROPLASTY, HIP, TOTAL, ANTERIOR APPROACH
Anesthesia: Spinal | Site: Hip | Laterality: Right
